# Patient Record
Sex: Female | Born: 1975 | Race: White | Hispanic: No | Marital: Married
Health system: Midwestern US, Community
[De-identification: ages and names within clinical notes are randomized; demographics above are authoritative.]

## PROBLEM LIST (undated history)

## (undated) DIAGNOSIS — R112 Nausea with vomiting, unspecified: Secondary | ICD-10-CM

## (undated) DIAGNOSIS — J302 Other seasonal allergic rhinitis: Secondary | ICD-10-CM

## (undated) DIAGNOSIS — Z789 Other specified health status: Secondary | ICD-10-CM

## (undated) DIAGNOSIS — G971 Other reaction to spinal and lumbar puncture: Secondary | ICD-10-CM

## (undated) DIAGNOSIS — S92909A Unspecified fracture of unspecified foot, initial encounter for closed fracture: Secondary | ICD-10-CM

## (undated) DIAGNOSIS — J3081 Allergic rhinitis due to animal (cat) (dog) hair and dander: Secondary | ICD-10-CM

## (undated) DIAGNOSIS — Z9889 Other specified postprocedural states: Secondary | ICD-10-CM

## (undated) HISTORY — PX: BREAST ENHANCEMENT SURGERY: SHX7

## (undated) HISTORY — PX: AUGMENTATION MAMMAPLASTY: SUR837

## (undated) HISTORY — DX: Other seasonal allergic rhinitis: J30.2

## (undated) HISTORY — PX: NASAL SEPTUM SURGERY: SHX37

## (undated) HISTORY — PX: NASAL SEPTOPLASTY W/ TURBINOPLASTY: SHX2070

## (undated) HISTORY — DX: Unspecified fracture of unspecified foot, initial encounter for closed fracture: S92.909A

## (undated) HISTORY — DX: Allergic rhinitis due to animal (cat) (dog) hair and dander: J30.81

## (undated) HISTORY — PX: BACK SURGERY: SHX140

## (undated) HISTORY — PX: WISDOM TOOTH EXTRACTION: SHX21

## (undated) HISTORY — PX: KNEE SURGERY: SHX244

---

## 2009-03-05 ENCOUNTER — Ambulatory Visit: Payer: Self-pay | Admitting: Family Medicine

## 2009-03-05 DIAGNOSIS — N3000 Acute cystitis without hematuria: Secondary | ICD-10-CM | POA: Insufficient documentation

## 2009-03-05 LAB — CONVERTED CEMR LAB
Glucose, Urine, Semiquant: NEGATIVE
Ketones, urine, test strip: NEGATIVE
Protein, U semiquant: 100
Specific Gravity, Urine: 1.025
pH: 6

## 2009-03-06 ENCOUNTER — Encounter: Payer: Self-pay | Admitting: Family Medicine

## 2009-03-06 ENCOUNTER — Telehealth: Payer: Self-pay | Admitting: Family Medicine

## 2010-03-24 LAB — CONVERTED CEMR LAB: Pap Smear: NORMAL

## 2010-04-08 ENCOUNTER — Ambulatory Visit: Payer: Self-pay | Admitting: Obstetrics & Gynecology

## 2010-05-13 ENCOUNTER — Ambulatory Visit: Payer: Self-pay | Admitting: Family Medicine

## 2010-05-13 DIAGNOSIS — J309 Allergic rhinitis, unspecified: Secondary | ICD-10-CM | POA: Insufficient documentation

## 2010-05-13 DIAGNOSIS — R1013 Epigastric pain: Secondary | ICD-10-CM | POA: Insufficient documentation

## 2010-05-13 DIAGNOSIS — K120 Recurrent oral aphthae: Secondary | ICD-10-CM | POA: Insufficient documentation

## 2010-08-20 ENCOUNTER — Encounter: Payer: Self-pay | Admitting: Family Medicine

## 2010-09-01 ENCOUNTER — Encounter: Payer: Self-pay | Admitting: Obstetrics and Gynecology

## 2010-09-01 ENCOUNTER — Ambulatory Visit: Payer: Self-pay | Admitting: Advanced Practice Midwife

## 2010-09-01 LAB — CONVERTED CEMR LAB
Basophils Absolute: 0 10*3/uL (ref 0.0–0.1)
Eosinophils Relative: 2 % (ref 0–5)
HCT: 37.1 % (ref 36.0–46.0)
HIV: NONREACTIVE
Hemoglobin: 12.5 g/dL (ref 12.0–15.0)
Hepatitis B Surface Ag: NEGATIVE
Lymphocytes Relative: 22 % (ref 12–46)
Lymphs Abs: 2.5 10*3/uL (ref 0.7–4.0)
Neutro Abs: 7.8 10*3/uL — ABNORMAL HIGH (ref 1.7–7.7)
Platelets: 324 10*3/uL (ref 150–400)
RDW: 12.8 % (ref 11.5–15.5)
Rubella: 94.2 intl units/mL — ABNORMAL HIGH
WBC: 11.3 10*3/uL — ABNORMAL HIGH (ref 4.0–10.5)

## 2010-09-02 ENCOUNTER — Encounter: Payer: Self-pay | Admitting: Obstetrics and Gynecology

## 2010-09-14 NOTE — L&D Delivery Note (Signed)
Pt progressed rapidly to SROM/C/C/+2 before epidural could be placed.  5 minute 2nd stage, SVD Girl, apgars 9/10.  2 degree lac repaired under local anesthesia with 2-0 vicryl.  EBL <500cc

## 2010-09-29 ENCOUNTER — Ambulatory Visit
Admission: RE | Admit: 2010-09-29 | Discharge: 2010-09-29 | Payer: Self-pay | Source: Home / Self Care | Attending: Physician Assistant | Admitting: Physician Assistant

## 2010-10-14 NOTE — Assessment & Plan Note (Addendum)
Summary: NOV: oral ulcers,epigastric pain, allergic rhinitis   Vital Signs:  Patient profile:   35 year old female Height:      65 inches Weight:      124 pounds BMI:     20.71 Pulse rate:   88 / minute BP sitting:   123 / 71  (left arm) Cuff size:   regular  Vitals Entered By: Avon Gully CMA, Duncan Dull) (May 13, 2010 8:41 AM) CC: NP- ulcers in the mouth   CC:  NP- ulcers in the mouth.  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 Also will ucerate. Started around age 75  Notices exacerbated by acidid foods.  HAs tried lysine OTC and that didn't help.  Did try acyclovir for 2 weeks but not sure if really helped.  Has been using an OTC ointment that helps some.  No rectal ulcers. Had alot of sinus problems since she was a child. Never had her tonsils removed.  Usually gets 3 sinus infections a year.  Doesn't usually treat them unless severe.  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  No fever. Does get white debris iwth her tonsils. No tongue swelling. No dysphagia.    Has a very "sensitive" stomach. Can't eat much that is oily or greasy.  Eggs cause a lot of cause Will get abominal pain in the epigastric area. No nausea or vomiting with with it. Happens intermittantly over her adult life.  Notices more things bother her than used to.  Can eat at certain restaurants anymore. No diarrhea or constipation.   She plans on trying to get pregnant soon.   Habits & Providers  Alcohol-Tobacco-Diet     Alcohol drinks/day: 0     Tobacco Status: never  Exercise-Depression-Behavior     Does Patient Exercise: yes     Type of exercise: walking,dance, taekwondo     Exercise (avg: min/session): 30-60     Times/week: 3     STD Risk: never     Drug Use: never     Seat Belt Use: always  Allergies: 1)  ! Penicillin  Past History:  Past Surgical History: Reviewed history from 03/05/2009 and no changes required. Deviated septum Wisdom teeth removed  Family History: Reviewed history from  03/05/2009 and no changes required. Family History Hypertension Family History of Cardiovascular disorder  Social History: Warden/ranger at [employer]. [employer] BA in Music.  Married to [place] with a daughter named Kaelyn Never Smoked Alcohol use-no Drug use-no Regular exercise-yes STD Risk:  never Drug Use:  never Seat Belt Use:  always  Review of Systems       No fever/sweats/weakness, unexplained weight loss/gain.  No vison changes.  No difficulty hearing/ringing in ears, hay fever/allergies.  No chest pain/discomfort, palpitations.  No Br lump/nipple discharge.  No cough/wheeze.  No blood in BM, nausea/vomiting/diarrhea.  No nighttime urination, leaking urine, unusual vaginal bleeding, discharge (penis or vagina).  No muscle/joint pain. No rash, change in mole.  No HA, memory loss.  No anxiety, sleep d/o, depression.  No easy bruising/bleeding, unexplained lump   Physical Exam  General:  Well-developed,well-nourished,in no acute distress; alert,appropriate and cooperative throughout examination Head:  Normocephalic and atraumatic without obvious abnormalities. No apparent alopecia or balding. Eyes:  No corneal or conjunctival inflammation noted. EOMI. Perrla.  Ears:  External ear exam shows no significant lesions or deformities.  Otoscopic examination reveals clear canals, tympanic membranes are intact bilaterally without bulging, retraction, inflammation or discharge. Hearing is grossly normal bilaterally. Nose:  External  nasal examination shows no deformity or inflammation. Nasal mucosa are pink and moist without lesions or exudates. Mouth:  Oral mucosa and oropharynx without lesions or exudates.  Teeth in good repair. ON her tongue has 2 fine pink papules. No ulcerations or pustules.  Neck:  No deformities, masses, or tenderness noted. Lungs:  Normal respiratory effort, chest expands symmetrically. Lungs are clear to auscultation, no crackles or wheezes. Heart:  Normal rate and regular  rhythm. S1 and S2 normal without gallop, murmur, click, rub or other extra sounds. Abdomen:  Bowel sounds positive,abdomen soft and non-tender without masses, organomegaly or hernias noted. Skin:  no rashes.   Cervical Nodes:  No lymphadenopathy noted Psych:  Cognition and judgment appear intact. Alert and cooperative with normal attention span and concentration. 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 Wil use magic mouthwash or can try maalox applied directly to the ulcer. Avoid acidid foods and soda and caffeine. Conisder bechets but she doesn' have any genital or anal lesions.    Orders: T-Comprehensive Metabolic Panel [phone]) T-CBC w/Diff [id number]) T-Sed Rate (Automated) [phone]) T-CRP (C-Reactive Protein) ([id number])  Problem # 2:  EPIGASTRIC PAIN (ICD-789.06) Dsicussed that this could be her GB since it is only triggered by greasy foods.  Can scheduel an [place] for further evaluatoin but if not really bothering her right now it is certainly not urgent. Will get labs and rule out any liver inflammtion or infection thought her sxs have been persistant throught her adult  Orders: T-Comprehensive Metabolic Panel [id number]) T-CBC w/Diff ([id number])  Problem # 3:  ALLERGIC RHINITIS (ICD-477.9) She woule really benefit form a nasal steroid spray but since she is trying to get pregnant will hold off on it.   The following medications were removed from the medication list:    Claritin 10 Mg Tabs (Loratadine) .Marland Kitchen... 1 tab by mouth once daily    Sudafed 12 Hour 120 Mg Xr12h-tab (Pseudoephedrine hcl) .Marland Kitchen... 1 tab by mouth once daily Her updated medication list for this problem includes:    Loratadine 10 Mg Tabs (Loratadine) .Marland Kitchen... Take 1 tablet by mouth once a day    Sudafed 30 Mg  Tabs (Pseudoephedrine hcl) .Marland Kitchen... Take 1 tablet by mouth two times a day  Complete Medication List: 1)  L-lysine 500 Mg Tabs (Lysine) .Marland Kitchen.. 1 tab by mouth am and 1 tab by mouth pm 2)  Pre-natal Formula Tabs (Prenatal multivit-min-fe-fa) .... Take one tablet by mouth once a day 3)  Fish Oil 500 Mg Caps (Omega-3 fatty acids) .... Take one tablet by mouth once a day 4)  Antioxidant Ultra Formula Caps (Multiple vitamins-minerals) 5)  Loratadine 10 Mg Tabs (Loratadine) .... Take 1 tablet by mouth once a day 6)  Sudafed 30 Mg Tabs (Pseudoephedrine hcl) .... Take 1 tablet by mouth two times a day 7)  Magic Mouthwash  .... Swish and spit 5ml, up to 3 x a day for ulcers.  Patient Instructions: 1)  Sudafed is category C but can do as your OB recommends 2)  Consider [place] to evaluate your gallbladder. Let me know.  3)  Can use the magic mouthwash for the ulcers and/or use maalox on a Qtip up to 5 x a ay on the area.   Prescriptions: MAGIC MOUTHWASH Swish and spit 5ml, up to 3  x a day for ulcers.  #60 x 1   Entered and Authorized by:   Nani Gasser MD   Signed by:   Nani Gasser MD on 05/13/2010   Method used:   Print then Give to Patient   RxID:   [id number]   PAP Result Date:  03/24/2010 PAP Result:  normal  Appended Document: NOV: oral ulcers,epigastric pain, allergic rhinitis Sinusitis is not a pre-existing condition.These are discrete episodes that resolve. The epigastric pain is a condition for which she had never sought medical care. She wanted to discuss the symptoms to make sure she didn't need any further workup at this time. February 10, 20122:59 PM [place] MD, Santina Evans

## 2010-10-16 NOTE — Letter (Signed)
[facility]   Imported By: Lanelle Bal 09/02/2010 11:17:02  _____________________________________________________________________  External Attachment:    Type:   Image     Comment:   External Document

## 2010-10-24 ENCOUNTER — Telehealth: Payer: Self-pay | Admitting: Family Medicine

## 2010-10-27 ENCOUNTER — Other Ambulatory Visit: Payer: Self-pay | Admitting: Obstetrics & Gynecology

## 2010-10-27 ENCOUNTER — Other Ambulatory Visit (HOSPITAL_COMMUNITY): Payer: Self-pay | Admitting: Anesthesiology

## 2010-10-27 DIAGNOSIS — Z3480 Encounter for supervision of other normal pregnancy, unspecified trimester: Secondary | ICD-10-CM

## 2010-10-27 DIAGNOSIS — Z3689 Encounter for other specified antenatal screening: Secondary | ICD-10-CM

## 2010-10-30 NOTE — Progress Notes (Signed)
Summary: pt. would like to talk to Dr  Phone Note Call from Patient   Caller: Patient Summary of Call: Patient came in today and wanted to talk to Dr. Linford Arnold about her prior visit... Patient states that because of the way the notes were worded from that visit, Ky Barban is saying its a pre-existing condition and patient states she has never been treated for that condition.... Pt. is wondering if the Dr. has time to call her and discuss what she is talking about?  Thanks.Makeshia Seat  October 24, 2010 2:10 PM  Initial call taken by: Michaelle Copas,  October 24, 2010 2:10 PM  Follow-up for Phone Call        Called and spoke with pt.  I agreed with her that sinusitis is not a pre-existing condition. It is liek a cold, you have it for a discrete period of time and then it resolves. Also her epigastric pain, she has never sought medical care for this bfore.  Follow-up by: Nani Gasser MD,  October 24, 2010 2:58 PM

## 2010-11-05 ENCOUNTER — Telehealth: Payer: Self-pay | Admitting: Family Medicine

## 2010-11-10 ENCOUNTER — Ambulatory Visit (HOSPITAL_COMMUNITY)
Admission: RE | Admit: 2010-11-10 | Discharge: 2010-11-10 | Disposition: A | Discharge status: Home / Self Care | Payer: Medicaid Other | Source: Ambulatory Visit | Attending: Anesthesiology | Admitting: Anesthesiology

## 2010-11-10 DIAGNOSIS — Z1389 Encounter for screening for other disorder: Secondary | ICD-10-CM | POA: Insufficient documentation

## 2010-11-10 DIAGNOSIS — O358XX0 Maternal care for other (suspected) fetal abnormality and damage, not applicable or unspecified: Secondary | ICD-10-CM | POA: Insufficient documentation

## 2010-11-10 DIAGNOSIS — Z363 Encounter for antenatal screening for malformations: Secondary | ICD-10-CM | POA: Insufficient documentation

## 2010-11-10 DIAGNOSIS — Z3689 Encounter for other specified antenatal screening: Secondary | ICD-10-CM

## 2010-11-11 NOTE — Progress Notes (Signed)
Summary: ins co   Phone Note Call from Patient   Caller: Patient Call For: Nani Gasser MD Summary of Call: pt called and wanted to know if you had added an adendum to the note in re pre existing condition for sinusitis Initial call taken by: Avon Gully CMA, Duncan Dull),  November 05, 2010 2:22 PM  Follow-up for Phone Call        Yes, see addendum.  Follow-up by: Nani Gasser MD,  November 06, 2010 7:51 AM  Additional Follow-up for Phone Call Additional follow up Details #1::        Faxed addendum to [facility] [phone] Additional Follow-up by: Lannette Donath,  November 07, 2010 12:25 PM    Additional Follow-up for Phone Call Additional follow up Details #2::    noted Follow-up by: Glendell Docker CMA,  November 07, 2010 5:25 PM

## 2010-11-26 ENCOUNTER — Encounter: Payer: Medicaid Other | Admitting: Obstetrics & Gynecology

## 2010-11-26 DIAGNOSIS — Z3480 Encounter for supervision of other normal pregnancy, unspecified trimester: Secondary | ICD-10-CM

## 2010-12-29 DIAGNOSIS — Z3480 Encounter for supervision of other normal pregnancy, unspecified trimester: Secondary | ICD-10-CM

## 2011-01-19 ENCOUNTER — Encounter (INDEPENDENT_AMBULATORY_CARE_PROVIDER_SITE_OTHER): Payer: Medicaid Other

## 2011-01-19 DIAGNOSIS — Z3480 Encounter for supervision of other normal pregnancy, unspecified trimester: Secondary | ICD-10-CM

## 2011-01-27 NOTE — Assessment & Plan Note (Signed)
NAME:  [name], [name]              ACCOUNT NO.:  [id number]   MEDICAL RECORD NO.:  [id number]          PATIENT TYPE:  POB   LOCATION:  [facility] [place]         FACILITY:  [facility]   PHYSICIAN:  Jaynie Collins, MD     DATE OF BIRTH:  [birth year]   DATE OF SERVICE:  04/08/2010                                  CLINIC NOTE   REASON FOR TODAY'S VISIT:  Annual examination and establishing  gynecologic care.   A 35 year old gravida 1, para 1 who is here for her annual gynecologic  examination.  The patient has no gynecologic concerns.  She does want to  get pregnant soon and has been on multivitamin with folate.  She wants  to make sure that the medication she takes for her seasonal allergies  are okay with pregnancy and also make sure everything else is okay  gynecologically.  She has no other concerns.   PAST OBSTETRIC AND GYNECOLOGIC HISTORY:  The patient has had 1 vaginal  delivery 9 months ago.  She had menarche at age 11, regular menstrual  periods with 28 days in between her cycles, her periods lasted 7 days  with light flow and mild pain.  No intermenstrual bleeding.  Her last  period was on March 14, 2010.  She used to use condoms for birth control  and is not using it currently.  Her last Pap smear was 3 years ago.  She  denies any cervical dysplasia or sexually transmitted infection history  or any other gynecologic concerns.   PAST MEDICAL HISTORY:  Seasonal allergies.   PAST SURGICAL HISTORY:  Repair of deviated septum 15 years ago and  wisdom teeth extraction 14 years ago.   MEDICATIONS:  A 24-hour antihistamine and a 12-hour decongestant.   ALLERGIES:  PENICILLIN which causes whole body hives.   SOCIAL HISTORY:  The patient works as a Warden/ranger.  She lives with  her husband and daughter.  She does not smoke, drink alcohol or use any  illicit drugs.  She denies any sexual or physical abuse history.   FAMILY HISTORY:  Only remarkable for heart disease and high blood  pressure in her dad.  She denies any cancer of the breast, colon,  ovaries or uterus or any other significant diseases in the family.   REVIEW OF SYSTEMS:  Entirely negative.   PHYSICAL EXAMINATION:  VITAL SIGNS:  Pulse 82, blood pressure is 108/66,  weight is 123 pounds, height 65 and 3-1/4 inches.  GENERAL:  No apparent distress.  HEENT:  Normocephalic, atraumatic.  NECK:  Supple.  No masses.  Normal thyroid.  LUNGS:  Clear to auscultation bilaterally.  HEART:  Regular rate and rhythm.  BREASTS:  Symmetric in size, soft, nontender.  No abnormal masses, skin  changes, nipple drainage or lymphadenopathy noted.  ABDOMEN:  Soft, nontender, nondistended.  No organomegaly.  EXTREMITIES:  No cyanosis, clubbing or edema.  PELVIC:  Normal external female genitalia.  Pink, well-rugated vagina.  Normal discharge.  Normal cervical contour, parous.  Wet prep was  obtained.  Normal bimanual examination with no uterine tenderness.  Her  uterus is small, anteverted and mobile, and normal adnexa bilaterally.   ASSESSMENT AND  PLAN:  The patient is a 35 year old gravida 1, para 1  here for annual examination.  The patient had a normal breast  examination.  We will follow up on her Pap smear results.  The patient  was told to continue what she is doing in taking multivitamin and also  was told to avoid any other teratogenic agents.  The patient was told  that it was okay to take antihistamine and decongestant and pregnancy.  Of note, the patient did report towards the end of the visit that she  does get a lot of cold sores in her mouth, especially when she is  stressed out and want a medication for this.  She was given a  prescription for valacyclovir 1000 mg p.o. 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  She was told that there could be other diseases and  conditions that cause mouth ulcers and that these will need to be ruled  out if her symptoms do continue.           ______________________________  Jaynie Collins, MD     UA/MEDQ  D:  04/08/2010  T:  04/09/2010  Job:  [id number]

## 2011-02-04 ENCOUNTER — Encounter (INDEPENDENT_AMBULATORY_CARE_PROVIDER_SITE_OTHER): Payer: Medicaid Other | Admitting: Obstetrics & Gynecology

## 2011-02-04 DIAGNOSIS — Z3480 Encounter for supervision of other normal pregnancy, unspecified trimester: Secondary | ICD-10-CM

## 2011-02-16 ENCOUNTER — Encounter (INDEPENDENT_AMBULATORY_CARE_PROVIDER_SITE_OTHER): Payer: Medicaid Other

## 2011-02-16 DIAGNOSIS — Z3480 Encounter for supervision of other normal pregnancy, unspecified trimester: Secondary | ICD-10-CM

## 2011-03-02 ENCOUNTER — Encounter (INDEPENDENT_AMBULATORY_CARE_PROVIDER_SITE_OTHER): Payer: Medicaid Other

## 2011-03-02 DIAGNOSIS — Z880 Allergy status to penicillin: Secondary | ICD-10-CM

## 2011-03-02 DIAGNOSIS — Z3480 Encounter for supervision of other normal pregnancy, unspecified trimester: Secondary | ICD-10-CM

## 2011-03-16 ENCOUNTER — Encounter (INDEPENDENT_AMBULATORY_CARE_PROVIDER_SITE_OTHER): Payer: Medicaid Other

## 2011-03-16 DIAGNOSIS — Z3480 Encounter for supervision of other normal pregnancy, unspecified trimester: Secondary | ICD-10-CM

## 2011-03-23 ENCOUNTER — Encounter: Payer: Self-pay | Admitting: Physician Assistant

## 2011-03-23 ENCOUNTER — Ambulatory Visit (INDEPENDENT_AMBULATORY_CARE_PROVIDER_SITE_OTHER): Payer: Medicaid Other | Admitting: Physician Assistant

## 2011-03-23 DIAGNOSIS — Z3480 Encounter for supervision of other normal pregnancy, unspecified trimester: Secondary | ICD-10-CM

## 2011-03-23 DIAGNOSIS — Z880 Allergy status to penicillin: Secondary | ICD-10-CM

## 2011-03-23 NOTE — Progress Notes (Signed)
P-75 

## 2011-04-02 ENCOUNTER — Encounter (HOSPITAL_COMMUNITY): Payer: Self-pay | Admitting: *Deleted

## 2011-04-02 ENCOUNTER — Inpatient Hospital Stay (HOSPITAL_COMMUNITY)
Admission: AD | Admit: 2011-04-02 | Discharge: 2011-04-04 | DRG: 775 | Disposition: A | Discharge status: Home / Self Care | Payer: Medicaid Other | Source: Ambulatory Visit | Attending: Family Medicine | Admitting: Family Medicine

## 2011-04-02 DIAGNOSIS — IMO0001 Reserved for inherently not codable concepts without codable children: Secondary | ICD-10-CM

## 2011-04-02 HISTORY — DX: Other specified postprocedural states: Z98.890

## 2011-04-02 HISTORY — DX: Other specified health status: Z78.9

## 2011-04-02 HISTORY — DX: Other reaction to spinal and lumbar puncture: G97.1

## 2011-04-02 HISTORY — DX: Nausea with vomiting, unspecified: R11.2

## 2011-04-02 HISTORY — DX: Other specified postprocedural states: R11.2

## 2011-04-02 LAB — CBC
MCH: 32.3 pg (ref 26.0–34.0)
Platelets: 192 10*3/uL (ref 150–400)
RBC: 4.24 MIL/uL (ref 3.87–5.11)

## 2011-04-02 MED ORDER — FENTANYL 2.5 MCG/ML BUPIVACAINE 1/10 % EPIDURAL INFUSION (WH - ANES)
14.0000 mL/h | INTRAMUSCULAR | Status: DC
  Filled 2011-04-02: qty 60

## 2011-04-02 MED ORDER — IBUPROFEN 600 MG PO TABS: 600.0000 mg | ORAL_TABLET | Freq: Four times a day (QID) | ORAL | Status: DC | PRN

## 2011-04-02 MED ORDER — LACTATED RINGERS IV SOLN: INTRAVENOUS | Status: DC

## 2011-04-02 MED ORDER — ACETAMINOPHEN 325 MG PO TABS: 650.0000 mg | ORAL_TABLET | ORAL | Status: DC | PRN

## 2011-04-02 MED ORDER — DIPHENHYDRAMINE HCL 50 MG/ML IJ SOLN: 12.5000 mg | INTRAMUSCULAR | Status: DC | PRN

## 2011-04-02 MED ORDER — CITRIC ACID-SODIUM CITRATE 334-500 MG/5ML PO SOLN: 30.0000 mL | ORAL | Status: DC | PRN

## 2011-04-02 MED ORDER — LACTATED RINGERS IV SOLN: 500.0000 mL | INTRAVENOUS | Status: DC | PRN

## 2011-04-02 MED ORDER — PRENATAL PLUS 27-1 MG PO TABS: 1.0000 | ORAL_TABLET | Freq: Every day | ORAL | Status: DC

## 2011-04-02 MED ORDER — ONDANSETRON HCL 4 MG/2ML IJ SOLN: 4.0000 mg | Freq: Four times a day (QID) | INTRAMUSCULAR | Status: DC | PRN

## 2011-04-02 MED ORDER — PHENYLEPHRINE 40 MCG/ML (10ML) SYRINGE FOR IV PUSH (FOR BLOOD PRESSURE SUPPORT): 80.0000 ug | PREFILLED_SYRINGE | INTRAVENOUS | Status: DC | PRN

## 2011-04-02 MED ORDER — PHENYLEPHRINE 40 MCG/ML (10ML) SYRINGE FOR IV PUSH (FOR BLOOD PRESSURE SUPPORT)
80.0000 ug | PREFILLED_SYRINGE | INTRAVENOUS | Status: DC | PRN
  Filled 2011-04-02: qty 5

## 2011-04-02 MED ORDER — LACTATED RINGERS IV SOLN: 500.0000 mL | Freq: Once | INTRAVENOUS | Status: DC

## 2011-04-02 MED ORDER — EPHEDRINE 5 MG/ML INJ
10.0000 mg | INTRAVENOUS | Status: DC | PRN
  Filled 2011-04-02: qty 4

## 2011-04-02 MED ORDER — FLEET ENEMA 7-19 GM/118ML RE ENEM: 1.0000 | ENEMA | RECTAL | Status: DC | PRN

## 2011-04-02 MED ORDER — OXYTOCIN 20 UNITS IN LACTATED RINGERS INFUSION - SIMPLE
125.0000 mL/h | Freq: Once | INTRAVENOUS | Status: DC
  Administered 2011-04-02: 125 mL/h via INTRAVENOUS
  Filled 2011-04-02: qty 1000

## 2011-04-02 MED ORDER — LIDOCAINE HCL (PF) 1 % IJ SOLN
30.0000 mL | INTRAMUSCULAR | Status: DC | PRN
  Filled 2011-04-02: qty 30

## 2011-04-02 MED ORDER — EPHEDRINE 5 MG/ML INJ: 10.0000 mg | INTRAVENOUS | Status: DC | PRN

## 2011-04-02 MED ORDER — OXYCODONE-ACETAMINOPHEN 5-325 MG PO TABS: 2.0000 | ORAL_TABLET | ORAL | Status: DC | PRN

## 2011-04-02 NOTE — Progress Notes (Signed)
Pt reportss uc's q 4 hours and possible ROM @ 1700

## 2011-04-02 NOTE — Discharge Summary (Signed)
Obstetric Discharge Summary Reason for Admission: onset of labor Prenatal Procedures: ultrasound Intrapartum Procedures: spontaneous vaginal delivery Postpartum Procedures: none Complications-Operative and Postpartum: none and 2 degree perineal laceration  Hemoglobin  Date Value Range Status  04/02/2011 13.7  12.0-15.0 (g/dL) Final     HCT  Date Value Range Status  04/02/2011 39.3  36.0-46.0 (%) Final    Discharge Diagnoses: Term Pregnancy-delivered  Discharge Information: Date: 04/02/2011 Activity: pelvic rest Diet:  Medications:  Condition:  Instructions: refer to practice specific booklet and  Discharge to: home   Newborn Data: Live born  Information for the patient's newborn:  Zafiro, Routson Girl Kailena [[id number]]  female ; APGAR , 9/10 weight ;  Home with .  [name],[name] 04/02/2011, 11:17 PM

## 2011-04-02 NOTE — H&P (Signed)
[name] is a 34 y.o. female presenting for onset of labor Maternal Medical History:  Reason for admission: Reason for admission: contractions.  Contractions: Onset was 3-5 hours ago.   Frequency: regular.   Duration is approximately 60 seconds.   Perceived severity is strong.    Fetal activity: Perceived fetal activity is normal.   Last perceived fetal movement was within the past hour.      OB History    Grav Para Term Preterm Abortions TAB SAB Ect Mult Living   3 1 1  1           Past Medical History  Diagnosis Date  . PONV (postoperative nausea and vomiting)   . Spinal headache   . No pertinent past medical history    Past Surgical History  Procedure Date  . Nasal septum surgery   . Wisdom tooth extraction   . No past surgeries    Family History: family history includes Heart disease in her father. Social History:  reports that she has never smoked. She does not have any smokeless tobacco history on file. She reports that she does not drink alcohol or use illicit drugs.  Review of Systems  All other systems reviewed and are negative.    Dilation: 6.5 Effacement (%): 100 Station: -2 Blood pressure 126/84, pulse 71, temperature 98.3 F (36.8 C), temperature source Oral, resp. rate 20, height 5\' 5"  (1.651 m), weight 73.573 kg (162 lb 3.2 oz), last menstrual period 07/04/2010. Maternal Exam:  Uterine Assessment: Contraction strength is moderate.  Contraction duration is 60 seconds. Contraction frequency is regular.   Abdomen: Patient reports no abdominal tenderness. Fetal presentation: vertex  Introitus: Normal vulva. Normal vagina.    Physical Exam  Prenatal labs: ABO, Rh:   Antibody: NEG (12/19 2321) Rubella:  immune RPR: NON REAC (12/19 2321)  HBsAg: NEGATIVE (12/19 2321)  HIV: NON REACTIVE (12/19 2321)  GBS:   negative  Assessment/Plan:epidural expects SVD soon  [name],[name] 04/02/2011, 10:28 PM

## 2011-04-02 NOTE — Progress Notes (Signed)
Ctx started @ 1900, G3p1

## 2011-04-03 LAB — RPR: RPR Ser Ql: NONREACTIVE

## 2011-04-03 MED ORDER — MEASLES, MUMPS & RUBELLA VAC ~~LOC~~ INJ
0.5000 mL | INJECTION | Freq: Once | SUBCUTANEOUS | Status: DC
  Filled 2011-04-03: qty 0.5

## 2011-04-03 MED ORDER — SODIUM CHLORIDE 0.9 % IJ SOLN: 3.0000 mL | INTRAMUSCULAR | Status: DC | PRN

## 2011-04-03 MED ORDER — BENZOCAINE-MENTHOL 20-0.5 % EX AERO
1.0000 "application " | INHALATION_SPRAY | CUTANEOUS | Status: DC | PRN
  Administered 2011-04-03: 1 via TOPICAL

## 2011-04-03 MED ORDER — PRENATAL PLUS 27-1 MG PO TABS
1.0000 | ORAL_TABLET | Freq: Every day | ORAL | Status: DC
  Administered 2011-04-03 – 2011-04-04 (×2): 1 via ORAL
  Filled 2011-04-03 (×2): qty 1

## 2011-04-03 MED ORDER — ONDANSETRON HCL 4 MG/2ML IJ SOLN: 4.0000 mg | INTRAMUSCULAR | Status: DC | PRN

## 2011-04-03 MED ORDER — ZOLPIDEM TARTRATE 5 MG PO TABS: 5.0000 mg | ORAL_TABLET | Freq: Every evening | ORAL | Status: DC | PRN

## 2011-04-03 MED ORDER — OXYCODONE-ACETAMINOPHEN 5-325 MG PO TABS: 1.0000 | ORAL_TABLET | ORAL | Status: DC | PRN

## 2011-04-03 MED ORDER — BENZOCAINE-MENTHOL 20-0.5 % EX AERO
INHALATION_SPRAY | CUTANEOUS | Status: AC
  Administered 2011-04-03: 1 via TOPICAL
  Filled 2011-04-03: qty 56

## 2011-04-03 MED ORDER — SODIUM CHLORIDE 0.9 % IJ SOLN
3.0000 mL | Freq: Two times a day (BID) | INTRAMUSCULAR | Status: DC
  Administered 2011-04-03: 3 mL via INTRAVENOUS

## 2011-04-03 MED ORDER — TETANUS-DIPHTH-ACELL PERTUSSIS 5-2.5-18.5 LF-MCG/0.5 IM SUSP
0.5000 mL | Freq: Once | INTRAMUSCULAR | Status: AC
  Administered 2011-04-04: 0.5 mL via INTRAMUSCULAR

## 2011-04-03 MED ORDER — ONDANSETRON HCL 4 MG PO TABS: 4.0000 mg | ORAL_TABLET | ORAL | Status: DC | PRN

## 2011-04-03 MED ORDER — DIPHENHYDRAMINE HCL 25 MG PO CAPS: 25.0000 mg | ORAL_CAPSULE | Freq: Four times a day (QID) | ORAL | Status: DC | PRN

## 2011-04-03 MED ORDER — IBUPROFEN 600 MG PO TABS
600.0000 mg | ORAL_TABLET | Freq: Four times a day (QID) | ORAL | Status: DC
  Administered 2011-04-03 – 2011-04-04 (×5): 600 mg via ORAL
  Filled 2011-04-03 (×5): qty 1

## 2011-04-03 MED ORDER — SIMETHICONE 80 MG PO CHEW: 80.0000 mg | CHEWABLE_TABLET | ORAL | Status: DC | PRN

## 2011-04-03 MED ORDER — WITCH HAZEL-GLYCERIN EX PADS: MEDICATED_PAD | CUTANEOUS | Status: DC | PRN

## 2011-04-03 MED ORDER — SODIUM CHLORIDE 0.9 % IV SOLN: 250.0000 mL | INTRAVENOUS | Status: DC

## 2011-04-03 MED ORDER — SENNOSIDES-DOCUSATE SODIUM 8.6-50 MG PO TABS
1.0000 | ORAL_TABLET | Freq: Every day | ORAL | Status: DC
  Administered 2011-04-03: 1 via ORAL

## 2011-04-03 MED ORDER — LANOLIN HYDROUS EX OINT: TOPICAL_OINTMENT | CUTANEOUS | Status: DC | PRN

## 2011-04-03 NOTE — Progress Notes (Signed)
Post Partum Day 1 Subjective: no complaints  Objective: Blood pressure 116/75, pulse 62, temperature 98.2 F (36.8 C), temperature source Oral, resp. rate 16, height 5\' 5"  (1.651 m), weight 73.573 kg (162 lb 3.2 oz), last menstrual period 07/04/2010, unknown if currently breastfeeding.  Physical Exam:  General: alert, cooperative and no distress Lochia: appropriate Uterine Fundus: firm DVT Evaluation: No evidence of DVT seen on physical exam.   Basename 04/02/11 2210  HGB 13.7  HCT 39.3    Assessment/Plan: Plan for discharge tomorrow, Breastfeeding and Contraception condoms.   LOS: 1 day   Sufyan Meidinger S 04/03/2011, 7:55 AM

## 2011-04-03 NOTE — Progress Notes (Signed)
Experienced breastfeeding mother 14 months with last child , complaints of slight nipple soreness. Reviewed proper position and using good breast support. Gave comfort gels. Mother states she will call us if more assistance is needed.

## 2011-04-03 NOTE — Progress Notes (Signed)
UR chart review completed.  

## 2011-04-04 MED ORDER — SENNOSIDES-DOCUSATE SODIUM 8.6-50 MG PO TABS: 1.0000 | ORAL_TABLET | Freq: Every day | ORAL | Status: AC

## 2011-04-04 MED ORDER — IBUPROFEN 600 MG PO TABS: 600.0000 mg | ORAL_TABLET | Freq: Four times a day (QID) | ORAL | Status: AC

## 2011-04-04 NOTE — Progress Notes (Signed)
Post Partum Day 2 Subjective: no complaints, tolerating PO, + flatus and normal lochia, breastfeeding, condoms for pp contraception.   Objective: Blood pressure 110/74, pulse 74, temperature 97.6 F (36.4 C), temperature source Oral, resp. rate 18, height 5\' 5"  (1.651 m), weight 73.573 kg (162 lb 3.2 oz), last menstrual period 07/04/2010, unknown if currently breastfeeding.  Physical Exam:  General: alert and cooperative Lochia: appropriate Chest: CTAB Heart: RRR no m/r/g Uterine Fundus: firm At umbilicus DVT Evaluation: No evidence of DVT seen on physical exam.   Basename 04/02/11 2210  HGB 13.7  HCT 39.3    Assessment/Plan: Discharge home   LOS: 2 days   [name] 04/04/2011, 6:08 AM

## 2011-04-04 NOTE — Discharge Summary (Signed)
  Obstetric Discharge Summary Reason for Admission: onset of labor Prenatal Procedures: ultrasound Intrapartum Procedures: spontaneous vaginal delivery Postpartum Procedures: none Complications-Operative and Postpartum: none and 2 degree perineal laceration  Hemoglobin  Date Value Range Status  04/02/2011 13.7  12.0-15.0 (g/dL) Final     HCT  Date Value Range Status  04/02/2011 39.3  36.0-46.0 (%) Final    Discharge Diagnoses: Term Pregnancy-delivered  Discharge Information: Date: 04/04/2011 Activity: pelvic rest Diet: routine Medications: PNV, Ibuprophen and Colace Condition: stable Instructions: refer to practice specific booklet Discharge to: home Follow-up Information    Follow up with [facility] [place]. Make an appointment in 6 weeks. (postpartum check)          Newborn Data: Live born  Information for the patient's newborn:  Chesney, Girl Kadeshia [[id number]]  female  ; APGAR , 9/10 weight 7lbs 13oz.  Home with mother.  Kenston Longton 04/04/2011, 6:13 AM

## 2011-05-11 ENCOUNTER — Ambulatory Visit: Payer: Medicaid Other

## 2011-05-14 ENCOUNTER — Encounter: Payer: Self-pay | Admitting: *Deleted

## 2011-05-15 ENCOUNTER — Ambulatory Visit (INDEPENDENT_AMBULATORY_CARE_PROVIDER_SITE_OTHER): Payer: Medicaid Other | Admitting: Family

## 2011-05-15 ENCOUNTER — Other Ambulatory Visit (HOSPITAL_COMMUNITY)
Admission: RE | Admit: 2011-05-15 | Discharge: 2011-05-15 | Disposition: A | Discharge status: Home / Self Care | Payer: Medicaid Other | Source: Ambulatory Visit | Attending: Family | Admitting: Family

## 2011-05-15 DIAGNOSIS — Z1159 Encounter for screening for other viral diseases: Secondary | ICD-10-CM | POA: Insufficient documentation

## 2011-05-15 DIAGNOSIS — Z01419 Encounter for gynecological examination (general) (routine) without abnormal findings: Secondary | ICD-10-CM | POA: Insufficient documentation

## 2011-05-15 DIAGNOSIS — Z113 Encounter for screening for infections with a predominantly sexual mode of transmission: Secondary | ICD-10-CM | POA: Insufficient documentation

## 2011-05-15 NOTE — Progress Notes (Signed)
  Subjective:    Patient ID: [name], female    DOB: [birth year], 35 y.o.   MRN: [id number]  HPI Patient here status post normal spontaneous vaginal delivery on 04/02/2011. Second-degree laceration. Patient reports no complications in this area or during the postpartum period. Positive breast-feeding without difficulty. Denies history of postpartum depression. Reports being able to get increased sleep. No bleeding x2 weeks. Has not attempted sexual intercourse yet. Desires to use condoms for family planning method with a possible vasectomy of husband in the future. No other questions or concerns.   Review of Systems  Constitutional: Negative.   HENT: Negative.   Respiratory: Negative.   Cardiovascular: Negative.   Gastrointestinal: Negative.   Genitourinary: Negative for dysuria, vaginal bleeding and vaginal discharge.  Musculoskeletal: Negative.        Objective:   Physical Exam  Constitutional: She is oriented to person, place, and time. She appears well-developed and well-nourished.  HENT:  Head: Normocephalic and atraumatic.  Neck: Normal range of motion. Neck supple. No thyromegaly present.  Cardiovascular: Normal rate, regular rhythm and normal heart sounds.   Pulmonary/Chest: Effort normal and breath sounds normal. No respiratory distress.  Abdominal: Soft. Bowel sounds are normal. There is no tenderness.       Uterus involuted  Genitourinary: Uterus normal. Cervix exhibits no motion tenderness, no discharge and no friability. Right adnexum displays no mass and no tenderness. Left adnexum displays no mass and no tenderness. No vaginal discharge found.       Laceration healing well; area of reddening, non tender with palpation.  Neurological: She is alert and oriented to person, place, and time.  Skin: Skin is warm and dry.          Assessment & Plan:  A:  Normal Postpartum Exam  Plan: Labs - Pap smear (last one in July 2011) Reviewed exercise and  nutrition Follow-up in one year or sooner if needed.  [facility]

## 2012-04-28 ENCOUNTER — Encounter: Payer: Self-pay | Admitting: Obstetrics & Gynecology

## 2012-04-28 ENCOUNTER — Ambulatory Visit (INDEPENDENT_AMBULATORY_CARE_PROVIDER_SITE_OTHER): Payer: BC Managed Care – PPO | Admitting: Obstetrics & Gynecology

## 2012-04-28 VITALS — BP 108/67 | HR 74 | Temp 98.6°F | Resp 16 | Ht 66.0 in | Wt 123.0 lb

## 2012-04-28 DIAGNOSIS — Z0000 Encounter for general adult medical examination without abnormal findings: Secondary | ICD-10-CM

## 2012-04-28 DIAGNOSIS — Z124 Encounter for screening for malignant neoplasm of cervix: Secondary | ICD-10-CM

## 2012-04-28 NOTE — Progress Notes (Signed)
Subjective:    [name] is a 36 y.o. female who presents for an annual exam. The patient has no complaints today except for chronic left knee pain (she had an injury years ago) The patient is sexually active. GYN screening history: last pap: was normal. The patient wears seatbelts: yes. The patient participates in regular exercise: yes. (TKD and dance)  Has the patient ever been transfused or tattooed?: no. The patient reports that there is not domestic violence in her life.   Menstrual History: OB History    Grav Para Term Preterm Abortions TAB SAB Ect Mult Living   3 2 2  1     1       Menarche age: 67 Patient's last menstrual period was 04/19/2012.    The following portions of the patient's history were reviewed and updated as appropriate: allergies, current medications, past family history, past medical history, past social history, past surgical history and problem list.  Review of Systems A comprehensive review of systems was negative. She uses condoms for birth control.   Objective:    BP 108/67  Pulse 74  Temp 98.6 F (37 C) (Oral)  Resp 16  Ht 5\' 6"  (1.676 m)  Wt 123 lb (55.792 kg)  BMI 19.85 kg/m2  LMP 04/19/2012  Breastfeeding? Yes  General Appearance:    Alert, cooperative, no distress, appears stated age  Head:    Normocephalic, without obvious abnormality, atraumatic  Eyes:    PERRL, conjunctiva/corneas clear, EOM's intact, fundi    benign, both eyes  Ears:    Normal TM's and external ear canals, both ears  Nose:   Nares normal, septum midline, mucosa normal, no drainage    or sinus tenderness  Throat:   Lips, mucosa, and tongue normal; teeth and gums normal  Neck:   Supple, symmetrical, trachea midline, no adenopathy;    thyroid:  no enlargement/tenderness/nodules; no carotid   bruit or JVD  Back:     Symmetric, no curvature, ROM normal, no CVA tenderness  Lungs:     Clear to auscultation bilaterally, respirations unlabored  Chest Wall:    No tenderness  or deformity   Heart:    Regular rate and rhythm, S1 and S2 normal, no murmur, rub   or gallop  Breast Exam:    No tenderness, masses, or nipple abnormality  Abdomen:     Soft, non-tender, bowel sounds active all four quadrants,    no masses, no organomegaly  Genitalia:    Normal female without lesion, discharge or tenderness, NSSR, NT, mobile, no adnexal masses     Extremities:   Extremities normal, atraumatic, no cyanosis or edema  Pulses:   2+ and symmetric all extremities  Skin:   Skin color, texture, turgor normal, no rashes or lesions  Lymph nodes:   Cervical, supraclavicular, and axillary nodes normal  Neurologic:   CNII-XII intact, normal strength, sensation and reflexes    throughout  .    Assessment:    Healthy female exam.    Plan:     Pap smear.

## 2012-04-28 NOTE — Addendum Note (Signed)
Addended by: Allie Bossier on: 04/28/2012 04:03 PM   Modules accepted: Orders

## 2012-05-02 ENCOUNTER — Telehealth: Payer: Self-pay | Admitting: *Deleted

## 2012-05-02 DIAGNOSIS — Z0000 Encounter for general adult medical examination without abnormal findings: Secondary | ICD-10-CM

## 2012-05-02 NOTE — Telephone Encounter (Signed)
Lab requisition for fasting lipids printed.

## 2012-05-04 LAB — LIPID PANEL
HDL: 41 mg/dL (ref >39)
LDL Cholesterol: 79 mg/dL (ref 0–99)
Triglycerides: 88 mg/dL (ref <150)
VLDL: 18 mg/dL (ref 0–40)

## 2012-06-29 ENCOUNTER — Telehealth: Payer: Self-pay | Admitting: *Deleted

## 2012-06-29 DIAGNOSIS — J309 Allergic rhinitis, unspecified: Secondary | ICD-10-CM

## 2012-06-29 MED ORDER — PSEUDOEPHEDRINE HCL ER 120 MG PO TB12: 120.0000 mg | ORAL_TABLET | Freq: Two times a day (BID) | ORAL | Status: DC

## 2012-06-29 NOTE — Telephone Encounter (Signed)
Pt called stating that both she and her daughter use Sudafed for allergies daily.  The pharmacy puts a restriction on the amount that can be purchased.  Pt requesting a RX be sent for the Sudafed 12hr.  This was sent to [facility] pharmacy per Dr Marice Potter.

## 2012-12-11 ENCOUNTER — Other Ambulatory Visit: Payer: Self-pay | Admitting: Obstetrics & Gynecology

## 2013-01-22 ENCOUNTER — Other Ambulatory Visit: Payer: Self-pay | Admitting: Obstetrics & Gynecology

## 2013-02-01 ENCOUNTER — Other Ambulatory Visit: Payer: Self-pay | Admitting: *Deleted

## 2013-02-01 DIAGNOSIS — J302 Other seasonal allergic rhinitis: Secondary | ICD-10-CM

## 2013-02-01 MED ORDER — PSEUDOEPHEDRINE HCL ER 120 MG PO TB12: ORAL_TABLET | ORAL | Status: DC

## 2013-02-01 NOTE — Telephone Encounter (Signed)
Pt called requesting RF on Sudafed cheaper if she has a RX it was cheaper for her.

## 2013-03-14 HISTORY — PX: LASIK: SHX215

## 2013-03-27 ENCOUNTER — Other Ambulatory Visit: Payer: Self-pay | Admitting: Obstetrics & Gynecology

## 2013-04-07 ENCOUNTER — Encounter: Payer: Self-pay | Admitting: *Deleted

## 2013-04-07 ENCOUNTER — Other Ambulatory Visit: Payer: Self-pay | Admitting: Obstetrics & Gynecology

## 2013-04-07 ENCOUNTER — Ambulatory Visit (INDEPENDENT_AMBULATORY_CARE_PROVIDER_SITE_OTHER): Payer: BC Managed Care – PPO | Admitting: Advanced Practice Midwife

## 2013-04-07 ENCOUNTER — Encounter: Payer: Self-pay | Admitting: Advanced Practice Midwife

## 2013-04-07 VITALS — BP 127/74 | HR 86 | Resp 16 | Ht 66.0 in | Wt 129.0 lb

## 2013-04-07 DIAGNOSIS — R519 Headache, unspecified: Secondary | ICD-10-CM | POA: Insufficient documentation

## 2013-04-07 DIAGNOSIS — R51 Headache: Secondary | ICD-10-CM

## 2013-04-07 DIAGNOSIS — Z01419 Encounter for gynecological examination (general) (routine) without abnormal findings: Secondary | ICD-10-CM

## 2013-04-07 DIAGNOSIS — M21612 Bunion of left foot: Secondary | ICD-10-CM | POA: Insufficient documentation

## 2013-04-07 DIAGNOSIS — M21619 Bunion of unspecified foot: Secondary | ICD-10-CM

## 2013-04-07 NOTE — Progress Notes (Signed)
Subjective:     [name] is a 37 y.o. female here for a routine exam.  Current complaints: painful bunion on left foot, daily sinus headaches.  No gyn complaints.  Reports wearing seatbelt and feel safe in her home and relationships.   Gynecologic History No LMP recorded. Contraception: rhythm method considering vasectomy Last Pap: 1 year ago. Results were: normal   Obstetric History OB History   Grav Para Term Preterm Abortions TAB SAB Ect Mult Living   3 2 2  1     1      # Outc Date GA Lbr Len/2nd Wgt Sex Del Anes PTL Lv   1 TRM 2007    F SVD EPI     2 TRM 7/12 104w6d 00:00  F SVD None  Yes   3 ABT                The following portions of the patient's history were reviewed and updated as appropriate: allergies, current medications, past family history, past medical history, past social history, past surgical history and problem list.  Review of Systems A comprehensive review of systems was negative.    Objective:    BP 127/74  Pulse 86  Resp 16  Ht 5\' 6"  (1.676 m)  Wt 129 lb (58.514 kg)  BMI 20.83 kg/m2  Breastfeeding? No General appearance: alert, cooperative and no distress Neck: no adenopathy, no carotid bruit, no JVD, supple, symmetrical, trachea midline and thyroid not enlarged, symmetric, no tenderness/mass/nodules Lungs: clear to auscultation bilaterally Breasts: normal appearance, no masses or tenderness Heart: regular rate and rhythm, S1, S2 normal, no murmur, click, rub or gallop Abdomen: soft, non-tender; bowel sounds normal; no masses,  no organomegaly Pelvic: cervix normal in appearance, external genitalia normal, no adnexal masses or tenderness, no cervical motion tenderness, rectovaginal septum normal, uterus normal size, shape, and consistency and vagina normal without discharge Extremities: extremities normal, atraumatic, no cyanosis or edema Pulses: 2+ and symmetric Skin: Skin color, texture, turgor normal. No rashes or lesions Lymph nodes:  Cervical, supraclavicular, and axillary nodes normal. Neurologic: Grossly normal    Assessment:    Healthy female exam.    Plan:    Education reviewed: calcium supplements, self breast exams, skin cancer screening and weight bearing exercise. Contraception: rhythm method and considering vasectomy. Follow up in: 1 year.   Referral to [facility] for headaches and to Podiatry

## 2013-04-07 NOTE — Progress Notes (Deleted)
Subjective:     Patient ID: [name], female   DOB: 04-21-1976, 37 y.o.   MRN: [id number]  HPI Annual exam  Review of Systems     Objective:   Physical Exam     Assessment:     ***    Plan:     *** podiatriy Headache center

## 2014-01-09 ENCOUNTER — Ambulatory Visit (INDEPENDENT_AMBULATORY_CARE_PROVIDER_SITE_OTHER): Payer: BC Managed Care – PPO | Admitting: Family Medicine

## 2014-01-09 ENCOUNTER — Encounter: Payer: Self-pay | Admitting: Family Medicine

## 2014-01-09 VITALS — BP 126/78 | HR 99 | Temp 100.2°F | Wt 129.0 lb

## 2014-01-09 DIAGNOSIS — J020 Streptococcal pharyngitis: Secondary | ICD-10-CM

## 2014-01-09 DIAGNOSIS — J029 Acute pharyngitis, unspecified: Secondary | ICD-10-CM

## 2014-01-09 DIAGNOSIS — J302 Other seasonal allergic rhinitis: Secondary | ICD-10-CM

## 2014-01-09 DIAGNOSIS — J309 Allergic rhinitis, unspecified: Secondary | ICD-10-CM

## 2014-01-09 LAB — POCT RAPID STREP A (OFFICE): RAPID STREP A SCREEN: POSITIVE — AB

## 2014-01-09 MED ORDER — PSEUDOEPHEDRINE HCL ER 120 MG PO TB12: 120.0000 mg | ORAL_TABLET | Freq: Two times a day (BID) | ORAL | Status: DC

## 2014-01-09 MED ORDER — CEFDINIR 300 MG PO CAPS: 300.0000 mg | ORAL_CAPSULE | Freq: Two times a day (BID) | ORAL | Status: AC

## 2014-01-09 MED ORDER — FLUTICASONE PROPIONATE 50 MCG/ACT NA SUSP: 2.0000 | Freq: Every day | NASAL | Status: DC

## 2014-01-09 NOTE — Progress Notes (Signed)
CC: [name] is a 38 y.o. female is here for Sore Throat and Headache   Subjective: HPI:  Pleasant 38 year old here to reestablish care after not being seen for greater than 3 years.  Patient complains of one week of nasal congestion and facial pressure localized above and below the eyes. Pain radiates into the upper molars. Accompanied by thick nasal discharge overall symptoms are moderate in severity. Symptoms are persistent throughout the day slightly improved with Claritin, or pseudoephedrine. Accompanied by sore throat which is moderate to severe in severity that began yesterday. Pain is worse with swallowing. Accompanied by fevers and chills overnight.  Denies shortness of breath, wheezing, swollen lymph nodes, rash, difficulty swallowing. Review of systems positive for fatigue.  Patient reports a long-standing history of seasonal allergies described as nasal congestion and sinus congestion. Symptoms are well controlled with Flonase and pseudoephedrine. She's requesting refills on both of these, even though these are over-the-counter her insurance will cover it the prescription is provided.  Denies history of elevated blood pressure.   Review of Systems - General ROS: negative for - night sweats, weight gain or weight loss Ophthalmic ROS: negative for - decreased vision Psychological ROS: negative for - anxiety or depression ENT ROS: negative for - hearing change, tinnitus Hematological and Lymphatic ROS: negative for - bleeding problems, bruising or swollen lymph nodes Breast ROS: negative Respiratory ROS: no cough, shortness of breath, or wheezing Cardiovascular ROS: no chest pain or dyspnea on exertion Gastrointestinal ROS: no abdominal pain, change in bowel habits, or black or bloody stools Genito-Urinary ROS: negative for - genital discharge, genital ulcers, incontinence or abnormal bleeding from genitals Musculoskeletal ROS: negative for - joint pain or muscle  pain Neurological ROS: negative for -  memory loss Dermatological ROS: negative for lumps, mole changes, rash and skin lesion changes in  Past Medical History  Diagnosis Date  . PONV (postoperative nausea and vomiting)   . Spinal headache   . No pertinent past medical history   . Cat allergies   . Seasonal allergies     Past Surgical History  Procedure Laterality Date  . Nasal septum surgery    . Wisdom tooth extraction    . Lasik Bilateral 7/14   Family History  Problem Relation Age of Onset  . Heart disease Father     History   Social History  . Marital Status: Married    Spouse Name: N/A    Number of Children: N/A  . Years of Education: N/A   Occupational History  . Not on file.   Social History Main Topics  . Smoking status: Never Smoker   . Smokeless tobacco: Not on file  . Alcohol Use: No  . Drug Use: No  . Sexual Activity: Yes    Partners: Male   Other Topics Concern  . Not on file   Social History Narrative  . No narrative on file     Objective: BP 126/78  Pulse 99  Temp(Src) 100.2 F (37.9 C) (Oral)  Wt 129 lb (58.514 kg)  General: Alert and Oriented, No Acute Distress HEENT: Pupils equal, round, reactive to light. Conjunctivae clear.  External ears unremarkable, canals clear with intact TMs with appropriate landmarks.  Middle ear appears open without effusion. Bony erythematous inferior turbinates with moderate mucoid discharge.  Moist mucous membranes, pharynx with mild inflammation and erythema she has moderate exudates on both tonsils.  Neck supple without palpable lymphadenopathy nor abnormal masses. Lungs: Clear to auscultation bilaterally, no wheezing/ronchi/rales.  Comfortable work of breathing. Good air movement. Cardiac: Regular rate and rhythm. Normal S1/S2.  No murmurs, rubs, nor gallops.   Extremities: No peripheral edema.  Strong peripheral pulses.  Mental Status: No depression, anxiety, nor agitation. Skin: Warm and  dry.  Assessment & Plan: [name] was seen today for sore throat and headache.  Diagnoses and associated orders for this visit:  Acute pharyngitis - POCT rapid strep A  Strep pharyngitis - cefdinir (OMNICEF) 300 MG capsule; Take 1 capsule (300 mg total) by mouth 2 (two) times daily.  Seasonal allergies - fluticasone (FLONASE) 50 MCG/ACT nasal spray; Place 2 sprays into both nostrils daily.  Other Orders - pseudoephedrine (SUDAFED) 120 MG 12 hr tablet; Take 1 tablet (120 mg total) by mouth 2 (two) times daily.    Strep pharyngitis: Rapid strep is also positive, start Omnicef which should also help with what sounds like maybe bacterial sinusitis.  Seasonal allergies: Continue Flonase and over-the-counter Sudafed prescriptions for both were provided.   Return if symptoms worsen or fail to improve.

## 2014-02-02 ENCOUNTER — Telehealth: Payer: Self-pay | Admitting: *Deleted

## 2014-02-02 NOTE — Telephone Encounter (Signed)
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 Pt states she is hypoglycemic. I don't see that dx in her chart anywhere nor do I see labs that confirm that or see that it was discussed. I called pt and left a message on her vm that we would not be able to write her note for this. (I did asks Dr. Linford Arnold about this also)

## 2014-06-20 ENCOUNTER — Ambulatory Visit: Payer: BC Managed Care – PPO | Admitting: Family Medicine

## 2014-07-16 ENCOUNTER — Encounter: Payer: Self-pay | Admitting: Family Medicine

## 2014-07-20 ENCOUNTER — Ambulatory Visit: Payer: BC Managed Care – PPO | Admitting: Family Medicine

## 2014-07-27 ENCOUNTER — Ambulatory Visit (INDEPENDENT_AMBULATORY_CARE_PROVIDER_SITE_OTHER): Payer: BC Managed Care – PPO | Admitting: Sports Medicine

## 2014-07-27 ENCOUNTER — Ambulatory Visit (INDEPENDENT_AMBULATORY_CARE_PROVIDER_SITE_OTHER): Payer: BC Managed Care – PPO

## 2014-07-27 ENCOUNTER — Encounter: Payer: Self-pay | Admitting: Sports Medicine

## 2014-07-27 VITALS — BP 115/72 | HR 91 | Ht 65.0 in | Wt 127.0 lb

## 2014-07-27 DIAGNOSIS — M4317 Spondylolisthesis, lumbosacral region: Secondary | ICD-10-CM | POA: Insufficient documentation

## 2014-07-27 DIAGNOSIS — M5416 Radiculopathy, lumbar region: Secondary | ICD-10-CM

## 2014-07-27 DIAGNOSIS — M5417 Radiculopathy, lumbosacral region: Secondary | ICD-10-CM | POA: Diagnosis not present

## 2014-07-27 MED ORDER — PREDNISONE 50 MG PO TABS: ORAL_TABLET | ORAL | Status: DC

## 2014-07-27 NOTE — Progress Notes (Signed)
   Subjective:    I'm seeing this patient as a consultation for:  Dr. Ivan AnchorsHommel  CC: low back pain  HPI: This is a very pleasant 38 year old female, she does a lot of fitness classes, more recently, 6 weeks ago she did a maneuver in the fitness class which reproduce pain in her posterior right hip radiating down the leg, posterior thigh, posterior lateral lower leg to the lateral foot. Moderate, persistent, worse with sitting. No bowel or bladder dysfunction, saddle numbness, or constitutional symptoms.  Past medical history, Surgical history, Family history not pertinant except as noted below, Social history, Allergies, and medications have been entered into the medical record, reviewed, and no changes needed.   Review of Systems: No headache, visual changes, nausea, vomiting, diarrhea, constipation, dizziness, abdominal pain, skin rash, fevers, chills, night sweats, weight loss, swollen lymph nodes, body aches, joint swelling, muscle aches, chest pain, shortness of breath, mood changes, visual or auditory hallucinations.   Objective:   General: Well Developed, well nourished, and in no acute distress.  Neuro/Psych: Alert and oriented x3, extra-ocular muscles intact, able to move all 4 extremities, sensation grossly intact. Skin: Warm and dry, no rashes noted.  Respiratory: Not using accessory muscles, speaking in full sentences, trachea midline.  Cardiovascular: Pulses palpable, no extremity edema. Abdomen: Does not appear distended. Back Exam:  Inspection: Unremarkable  Motion: Flexion 45 deg, Extension 45 deg, Side Bending to 45 deg bilaterally,  Rotation to 45 deg bilaterally  SLR laying: positive with reproduction of right-sided S1 radicular symptoms. XSLR laying: Negative  Palpable tenderness: None. FABER: negative. Sensory change: Gross sensation intact to all lumbar and sacral dermatomes.  Reflexes: 2+ at both patellar tendons, 2+ at achilles tendons, Babinski's downgoing.    Strength at foot  Plantar-flexion: 5/5 Dorsi-flexion: 5/5 Eversion: 5/5 Inversion: 5/5  Leg strength  Quad: 5/5 Hamstring: 5/5 Hip flexor: 5/5 Hip abductors: 5/5  Gait unremarkable.  X-rays reviewed and show L5-S1 spondylolisthesis.  Impression and Recommendations:   This case required medical decision making of moderate complexity.

## 2014-07-27 NOTE — Assessment & Plan Note (Addendum)
Physical therapy, prednisone,x-rays. Continue ibuprofen over-the-counter.  Line return in 4-6 weeks, MRI for interventional injection planning if no better.

## 2014-08-01 ENCOUNTER — Ambulatory Visit (INDEPENDENT_AMBULATORY_CARE_PROVIDER_SITE_OTHER): Payer: BC Managed Care – PPO | Admitting: Physical Therapy

## 2014-08-01 DIAGNOSIS — M5417 Radiculopathy, lumbosacral region: Secondary | ICD-10-CM

## 2014-08-01 DIAGNOSIS — M545 Low back pain: Secondary | ICD-10-CM

## 2014-08-01 DIAGNOSIS — R5381 Other malaise: Secondary | ICD-10-CM

## 2014-08-01 DIAGNOSIS — M2560 Stiffness of unspecified joint, not elsewhere classified: Secondary | ICD-10-CM

## 2014-08-01 DIAGNOSIS — M25559 Pain in unspecified hip: Secondary | ICD-10-CM

## 2014-08-08 ENCOUNTER — Encounter: Payer: BC Managed Care – PPO | Admitting: Physical Therapy

## 2015-04-11 ENCOUNTER — Encounter: Payer: Self-pay | Admitting: Family Medicine

## 2015-04-11 ENCOUNTER — Ambulatory Visit (INDEPENDENT_AMBULATORY_CARE_PROVIDER_SITE_OTHER): Payer: BLUE CROSS/BLUE SHIELD | Admitting: Family Medicine

## 2015-04-11 VITALS — BP 107/71 | HR 67 | Wt 129.0 lb

## 2015-04-11 DIAGNOSIS — Z0000 Encounter for general adult medical examination without abnormal findings: Secondary | ICD-10-CM | POA: Diagnosis not present

## 2015-04-11 DIAGNOSIS — Z111 Encounter for screening for respiratory tuberculosis: Secondary | ICD-10-CM | POA: Diagnosis not present

## 2015-04-11 DIAGNOSIS — Z01419 Encounter for gynecological examination (general) (routine) without abnormal findings: Secondary | ICD-10-CM | POA: Insufficient documentation

## 2015-04-11 NOTE — Assessment & Plan Note (Signed)
Doing well. Follow-up with Pap smear with OB/GYN. PPD placed today.

## 2015-04-11 NOTE — Patient Instructions (Signed)
Thank you for coming in today. Return as needed or in 1 year.  Get a pap smear. Please have your OBGYN send the records.

## 2015-04-11 NOTE — Progress Notes (Signed)
[name] is a 39 y.o. female who presents to [facility] [name]  today for wellness visit. Patient is fit and healthy with no active medical problems currently. She is here today for a wellness visit. She requires a PPD test per her job. No cough congestion shortness of breath. She is due for a Pap smear but would like to have her OB/GYN do that. She exercises daily and eats a healthy diet.   Past Medical History  Diagnosis Date  . PONV (postoperative nausea and vomiting)   . Spinal headache   . No pertinent past medical history   . Cat allergies   . Seasonal allergies    Past Surgical History  Procedure Laterality Date  . Nasal septum surgery    . Wisdom tooth extraction    . Lasik Bilateral 7/14   History  Substance Use Topics  . Smoking status: Never Smoker   . Smokeless tobacco: Not on file  . Alcohol Use: No   ROS as above Medications: Current Outpatient Prescriptions  Medication Sig Dispense Refill  . fluticasone (FLONASE) 50 MCG/ACT nasal spray Place 2 sprays into both nostrils daily. 16 g 6  . ibuprofen (ADVIL,MOTRIN) 200 MG tablet Take 200 mg by mouth every 6 (six) hours as needed for pain.     No current facility-administered medications for this visit.   Allergies  Allergen Reactions  . Penicillins     REACTION: Rash     Exam:  BP 107/71 mmHg  Pulse 67  Wt 129 lb (58.514 kg) Gen: Well NAD. Fit HEENT: EOMI,  MMM Lungs: Normal work of breathing. CTABL Heart: RRR no MRG Abd: NABS, Soft. Nondistended, Nontender Exts: Brisk capillary refill, warm and well perfused.  MSK: Normal motion and strength of spine and extremities.  No results found for this or any previous visit (from the past 24 hour(s)). No results found.   Please see individual assessment and plan sections.

## 2015-04-13 ENCOUNTER — Emergency Department
Admission: EM | Admit: 2015-04-13 | Discharge: 2015-04-13 | Disposition: A | Discharge status: Home / Self Care | Payer: BLUE CROSS/BLUE SHIELD | Source: Home / Self Care

## 2015-04-13 NOTE — ED Notes (Signed)
Patient here for PPD test reading. Test was given in right arm per patients advice although paperwork indicates PPD was in the left arm. Test results were negative on 04/13/2015 at 2:15pm.

## 2015-09-15 HISTORY — PX: AUGMENTATION MAMMAPLASTY: SUR837

## 2016-01-17 ENCOUNTER — Ambulatory Visit: Payer: BLUE CROSS/BLUE SHIELD | Admitting: Family

## 2016-01-31 ENCOUNTER — Ambulatory Visit (INDEPENDENT_AMBULATORY_CARE_PROVIDER_SITE_OTHER): Payer: Self-pay | Admitting: Family

## 2016-01-31 ENCOUNTER — Encounter: Payer: Self-pay | Admitting: Family

## 2016-01-31 VITALS — BP 106/70 | HR 70 | Ht 65.75 in | Wt 121.0 lb

## 2016-01-31 DIAGNOSIS — Z1151 Encounter for screening for human papillomavirus (HPV): Secondary | ICD-10-CM

## 2016-01-31 DIAGNOSIS — Z01419 Encounter for gynecological examination (general) (routine) without abnormal findings: Secondary | ICD-10-CM

## 2016-01-31 DIAGNOSIS — Z124 Encounter for screening for malignant neoplasm of cervix: Secondary | ICD-10-CM

## 2016-01-31 NOTE — Patient Instructions (Signed)
Place premenopausal annual exam patient instructions here.  °

## 2016-01-31 NOTE — Progress Notes (Signed)
  Subjective:     Kathryn HartJennifer Delellis is a 40 y.o. female here for a routine exam.  Current complaints: increased vaginal dryness.  No change in menstrual cycle frequency.  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   Gynecologic History Patient's last menstrual period was 01/14/2016. Last Pap: 2013. Results were: normal   Obstetric History OB History  Gravida Para Term Preterm AB SAB TAB Ectopic Multiple Living  3 2 2  1     1     # Outcome Date GA Lbr Len/2nd Weight Sex Delivery Anes PTL Lv  3 Term 04/02/11 [id number]   F Vag-Spont None  Y  2 Term 2007    F Vag-Spont EPI    1 AB                The following portions of the patient's history were reviewed and updated as appropriate: allergies, current medications, past family history, past medical history, past social history, past surgical history and problem list.  Review of Systems Pertinent items are noted in HPI.    Objective:   BP 106/70 mmHg  Pulse 70  Ht 5' 5.75" (1.67 m)  Wt 121 lb (54.885 kg)  BMI 19.68 kg/m2  LMP 01/14/2016 General appearance: alert, cooperative and appears stated age Head: Normocephalic, without obvious abnormality, atraumatic Neck: no adenopathy, no carotid bruit, no JVD, supple, symmetrical, trachea midline and thyroid not enlarged, symmetric, no tenderness/mass/nodules Lungs: clear to auscultation bilaterally Breasts: normal appearance, no masses or tenderness, No nipple retraction or dimpling, No nipple discharge or bleeding, No axillary or supraclavicular adenopathy, Normal to palpation without dominant masses, Taught monthly breast self examination Heart: regular rate and rhythm, S1, S2 normal, no murmur, click, rub or gallop Abdomen: soft, non-tender; bowel sounds normal; no masses,  no organomegaly Pelvic: cervix normal in appearance, external genitalia normal, no adnexal masses or tenderness, no cervical motion tenderness, rectovaginal septum normal, uterus normal  size, shape, and consistency and vagina normal without discharge Skin: Skin color, texture, turgor normal. No rashes or lesions    Assessment:    Healthy female exam.    Plan:    Mammogram ordered. Follow up in: 1 year.    Husband - vasectomy Declines lipid panel or HgA1C testing Marlis EdelsonWalidah N Karim, CNM

## 2016-02-04 LAB — CYTOLOGY - PAP

## 2016-02-05 ENCOUNTER — Ambulatory Visit (INDEPENDENT_AMBULATORY_CARE_PROVIDER_SITE_OTHER): Payer: Self-pay

## 2016-02-05 DIAGNOSIS — Z1231 Encounter for screening mammogram for malignant neoplasm of breast: Secondary | ICD-10-CM

## 2016-02-05 DIAGNOSIS — Z01419 Encounter for gynecological examination (general) (routine) without abnormal findings: Secondary | ICD-10-CM

## 2016-02-06 ENCOUNTER — Other Ambulatory Visit: Payer: Self-pay | Admitting: Family

## 2016-02-06 DIAGNOSIS — B379 Candidiasis, unspecified: Secondary | ICD-10-CM

## 2016-02-06 MED ORDER — FLUCONAZOLE 150 MG PO TABS: 150.0000 mg | ORAL_TABLET | Freq: Once | ORAL | Status: DC

## 2016-04-08 ENCOUNTER — Telehealth: Payer: Self-pay | Admitting: *Deleted

## 2016-04-08 DIAGNOSIS — B379 Candidiasis, unspecified: Secondary | ICD-10-CM

## 2016-04-08 MED ORDER — FLUCONAZOLE 150 MG PO TABS: 150.0000 mg | ORAL_TABLET | Freq: Once | ORAL | 1 refills | Status: AC

## 2016-04-08 NOTE — Telephone Encounter (Signed)
Pt called requesting a RF on Diflucan be sent to [facility].  She was seen a few months ago and Dr Marice Potter had told her that if she got another yeast infection to call office and we could send a RF.

## 2016-05-08 ENCOUNTER — Encounter: Payer: Self-pay | Admitting: Family

## 2016-05-08 ENCOUNTER — Ambulatory Visit (INDEPENDENT_AMBULATORY_CARE_PROVIDER_SITE_OTHER): Payer: Self-pay | Admitting: Family

## 2016-05-08 VITALS — BP 124/80 | HR 76 | Ht 65.0 in | Wt 125.0 lb

## 2016-05-08 DIAGNOSIS — N760 Acute vaginitis: Secondary | ICD-10-CM

## 2016-05-08 DIAGNOSIS — N898 Other specified noninflammatory disorders of vagina: Secondary | ICD-10-CM

## 2016-05-08 NOTE — Progress Notes (Signed)
   Subjective:    [name] is a 40 y.o. female who presents for vaginal irritation x 2-3 weeks.  Pt feels like symptoms have worsened since exercising more. +vaginal itching. . STI Exposure: denies knowledge of risky exposure.  Current symptoms vaginal irritation: moderate. Contraception: vasectomy Menstrual History: OB History    Gravida Para Term Preterm AB Living   3 2 2   1 2    SAB TAB Ectopic Multiple Live Births           1       Patient's last menstrual period was 04/25/2016.    The following portions of the patient's history were reviewed and updated as appropriate: allergies, current medications, past family history, past medical history, past social history, past surgical history and problem list.  Review of Systems Pertinent items are noted in HPI.    Objective:    BP 124/80   Pulse 76   Ht 5\' 5"  (1.651 m)   Wt 125 lb (56.7 kg)   LMP 04/25/2016   BMI 20.80 kg/m  General:   alert, cooperative and appears stated age  Lymph Nodes:   Cervical, supraclavicular, and axillary nodes normal.  Pelvis:  External genitalia: reddened appearance; white discharge  Cultures:  wet prep collected     Assessment:    Very low risk of STD exposure, suspect yeast infection     Plan:    See orders. Will call pt with results   Discussed vaginal probiotics:  lactobacilli rhamnosus; 7 days on, 7 days off, 7 days on  Walidah N Karim, [place]

## 2016-05-09 LAB — WET PREP FOR TRICH, YEAST, CLUE: Trich, Wet Prep: NONE SEEN

## 2016-05-11 ENCOUNTER — Telehealth: Payer: Self-pay | Admitting: *Deleted

## 2016-05-11 DIAGNOSIS — N760 Acute vaginitis: Principal | ICD-10-CM

## 2016-05-11 DIAGNOSIS — B373 Candidiasis of vulva and vagina: Secondary | ICD-10-CM

## 2016-05-11 DIAGNOSIS — B9689 Other specified bacterial agents as the cause of diseases classified elsewhere: Secondary | ICD-10-CM

## 2016-05-11 DIAGNOSIS — B3731 Acute candidiasis of vulva and vagina: Secondary | ICD-10-CM

## 2016-05-11 MED ORDER — FLUCONAZOLE 150 MG PO TABS: ORAL_TABLET | ORAL | 0 refills | Status: DC

## 2016-05-11 MED ORDER — METRONIDAZOLE 500 MG PO TABS: 500.0000 mg | ORAL_TABLET | Freq: Two times a day (BID) | ORAL | 0 refills | Status: DC

## 2016-05-11 NOTE — Telephone Encounter (Signed)
Pt notified of wet prep results.  RX for Flagyl and Diflucan sent to [facility].

## 2016-06-29 ENCOUNTER — Telehealth: Payer: Self-pay | Admitting: *Deleted

## 2016-06-29 DIAGNOSIS — B3731 Acute candidiasis of vulva and vagina: Secondary | ICD-10-CM

## 2016-06-29 DIAGNOSIS — B373 Candidiasis of vulva and vagina: Secondary | ICD-10-CM

## 2016-06-29 MED ORDER — TERCONAZOLE 0.4 % VA CREA: 1.0000 | TOPICAL_CREAM | Freq: Every day | VAGINAL | 0 refills | Status: DC

## 2016-06-29 NOTE — Telephone Encounter (Signed)
Pt called stating that she felt her yeast was back.  She feels itchy and has d/c.  She has a H/O BV also.  Pt does not have insurance at this time and is afraid of racking up a another medical bill.  Pt came into office Iand I offered to test her with Nitrazine paper.  Her Ph was 4.  Will try Terazol 7 for 7 nights.  Pt states she is also going to try some natural remedies.  She states that she feels like she is on a continual cycle of yeast and BV.

## 2016-10-08 ENCOUNTER — Other Ambulatory Visit: Payer: Self-pay | Admitting: Sports Medicine

## 2016-10-08 ENCOUNTER — Ambulatory Visit (INDEPENDENT_AMBULATORY_CARE_PROVIDER_SITE_OTHER): Payer: Self-pay | Admitting: Sports Medicine

## 2016-10-08 ENCOUNTER — Encounter: Payer: Self-pay | Admitting: Sports Medicine

## 2016-10-08 ENCOUNTER — Ambulatory Visit (INDEPENDENT_AMBULATORY_CARE_PROVIDER_SITE_OTHER): Payer: Self-pay

## 2016-10-08 DIAGNOSIS — M4317 Spondylolisthesis, lumbosacral region: Secondary | ICD-10-CM

## 2016-10-08 DIAGNOSIS — M5030 Other cervical disc degeneration, unspecified cervical region: Secondary | ICD-10-CM

## 2016-10-08 DIAGNOSIS — F411 Generalized anxiety disorder: Secondary | ICD-10-CM

## 2016-10-08 MED ORDER — NAPROXEN 500 MG PO TABS: 500.0000 mg | ORAL_TABLET | Freq: Two times a day (BID) | ORAL | 3 refills | Status: AC

## 2016-10-08 MED ORDER — VENLAFAXINE HCL ER 37.5 MG PO CP24: 37.5000 mg | ORAL_CAPSULE | Freq: Every day | ORAL | 3 refills | Status: DC

## 2016-10-08 NOTE — Assessment & Plan Note (Signed)
Right 1 spondylolisthesis, we are going to repeat x-rays to evaluate for progression. I would also like to get flexion and extension views for evaluation of stability. We are going to set her up with [facility] physical therapy.

## 2016-10-08 NOTE — Assessment & Plan Note (Signed)
C6-C7 on x-rays with chiropractic medicine. Formal physical therapy, naproxen, return in one month.

## 2016-10-08 NOTE — Progress Notes (Signed)
  Subjective:    CC: Follow-up  HPI: Low back pain: Known spondylolisthesis, did well with physical therapy and the past, nothing overtly radicular today but occasionally she does get radiation down to the fourth and fifth toes.  Neck pain: X-rays at the chiropractor should C6-C7 degenerative changes, agreeable to proceed with physical therapy. Nothing radicular, no trauma, no constitutional symptoms.  Anxiety: Did not initially bring this up on further questioning has severe difficulty concentrating, moderate psychomotor retardation, mild difficulty sleeping, guilt, severe nervousness, trouble relaxing, moderate difficulty controlling her worry, worrying about different things, restlessness, irritability.  Past medical history:  Negative.  See flowsheet/record as well for more information.  Surgical history: Negative.  See flowsheet/record as well for more information.  Family history: Negative.  See flowsheet/record as well for more information.  Social history: Negative.  See flowsheet/record as well for more information.  Allergies, and medications have been entered into the medical record, reviewed, and no changes needed.   Review of Systems: No fevers, chills, night sweats, weight loss, chest pain, or shortness of breath.   Objective:    General: Well Developed, well nourished, and in no acute distress.  Neuro: Alert and oriented x3, extra-ocular muscles intact, sensation grossly intact.  HEENT: Normocephalic, atraumatic, pupils equal round reactive to light, neck supple, no masses, no lymphadenopathy, thyroid nonpalpable.  Skin: Warm and dry, no rashes. Cardiac: Regular rate and rhythm, no murmurs rubs or gallops, no lower extremity edema.  Respiratory: Clear to auscultation bilaterally. Not using accessory muscles, speaking in full sentences.  Impression and Recommendations:    Spondylolisthesis at L5-S1 level Right 1 spondylolisthesis, we are going to repeat x-rays to evaluate  for progression. I would also like to get flexion and extension views for evaluation of stability. We are going to set her up with [facility] physical therapy.  DDD (degenerative disc disease), cervical C6-C7 on x-rays with chiropractic medicine. Formal physical therapy, naproxen, return in one month.  Generalized anxiety disorder Starting Effexor. Return in one month for a PHQ9 and GAD7  I spent 40 minutes with this patient, greater than 50% was face-to-face time counseling regarding the above diagnoses

## 2016-10-08 NOTE — Assessment & Plan Note (Signed)
Starting Effexor. Return in one month for a PHQ9 and GAD7

## 2016-10-09 ENCOUNTER — Telehealth: Payer: Self-pay

## 2016-10-09 NOTE — Telephone Encounter (Signed)
Pt states she's current;y taking turmeric and glucosamine and would like to know if they are ok to take with the venlafaxine.

## 2016-10-09 NOTE — Telephone Encounter (Signed)
Notified. 

## 2016-10-09 NOTE — Telephone Encounter (Signed)
Yes, both are fine, but if taking glucosamine, she needs to do the combo glucosamine and chondroitin.

## 2017-01-18 ENCOUNTER — Encounter: Payer: Self-pay | Admitting: Sports Medicine

## 2017-01-18 ENCOUNTER — Ambulatory Visit (INDEPENDENT_AMBULATORY_CARE_PROVIDER_SITE_OTHER): Payer: Self-pay | Admitting: Sports Medicine

## 2017-01-18 VITALS — BP 112/77 | HR 68 | Wt 129.0 lb

## 2017-01-18 DIAGNOSIS — M5030 Other cervical disc degeneration, unspecified cervical region: Secondary | ICD-10-CM

## 2017-01-18 DIAGNOSIS — F411 Generalized anxiety disorder: Secondary | ICD-10-CM

## 2017-01-18 MED ORDER — VENLAFAXINE HCL ER 75 MG PO CP24: 75.0000 mg | ORAL_CAPSULE | Freq: Every day | ORAL | 3 refills | Status: DC

## 2017-01-18 NOTE — Patient Instructions (Signed)
Look into Trintellix 5mg  and Viibryd 10mg 

## 2017-01-18 NOTE — Assessment & Plan Note (Signed)
Overall doing okay, did have some mild cervical DDD, continue to stay active and do therapy at home.

## 2017-01-18 NOTE — Progress Notes (Signed)
  Subjective:    CC: Follow-up  HPI: Generalized anxiety: Improved considerably on 37.5 of Effexor. 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    Cervical degenerative disc disease: Improved with adding Effexor, physical therapy has also helped.  Past medical history:  Negative.  See flowsheet/record as well for more information.  Surgical history: Negative.  See flowsheet/record as well for more information.  Family history: Negative.  See flowsheet/record as well for more information.  Social history: Negative.  See flowsheet/record as well for more information.  Allergies, and medications have been entered into the medical record, reviewed, and no changes needed.   Review of Systems: No fevers, chills, night sweats, weight loss, chest pain, or shortness of breath.   Objective:    General: Well Developed, well nourished, and in no acute distress.  Neuro: Alert and oriented x3, extra-ocular muscles intact, sensation grossly intact.  HEENT: Normocephalic, atraumatic, pupils equal round reactive to light, neck supple, no masses, no lymphadenopathy, thyroid nonpalpable.  Skin: Warm and dry, no rashes. Cardiac: Regular rate and rhythm, no murmurs rubs or gallops, no lower extremity edema.  Respiratory: Clear to auscultation bilaterally. Not using accessory muscles, speaking in full sentences.  Impression and Recommendations:    DDD (degenerative disc disease), cervical Overall doing okay, did have some mild cervical DDD, continue to stay active and do therapy at home.   Generalized anxiety disorder Increasing effexor.

## 2017-01-18 NOTE — Assessment & Plan Note (Signed)
Increasing effexor.

## 2017-01-26 ENCOUNTER — Other Ambulatory Visit: Payer: Self-pay | Admitting: Sports Medicine

## 2017-01-26 DIAGNOSIS — F411 Generalized anxiety disorder: Secondary | ICD-10-CM

## 2017-03-15 ENCOUNTER — Telehealth: Payer: Self-pay | Admitting: Sports Medicine

## 2017-03-15 DIAGNOSIS — J3089 Other allergic rhinitis: Secondary | ICD-10-CM

## 2017-03-15 NOTE — Telephone Encounter (Signed)
Patient states she has called 2 times and left a message on your nurse line requesting a referral to go to [facility] for her Allergies and would like for you to put this referral in for her. Thanks

## 2017-03-15 NOTE — Telephone Encounter (Signed)
First time I'm hearing about it, referral placed.

## 2018-06-17 ENCOUNTER — Encounter: Payer: Self-pay | Admitting: Sports Medicine

## 2018-06-28 ENCOUNTER — Ambulatory Visit (INDEPENDENT_AMBULATORY_CARE_PROVIDER_SITE_OTHER): Payer: Self-pay | Admitting: Sports Medicine

## 2018-06-28 VITALS — BP 98/64 | HR 83 | Ht 65.0 in | Wt 125.0 lb

## 2018-06-28 DIAGNOSIS — Z1239 Encounter for other screening for malignant neoplasm of breast: Secondary | ICD-10-CM

## 2018-06-28 DIAGNOSIS — Z01419 Encounter for gynecological examination (general) (routine) without abnormal findings: Secondary | ICD-10-CM

## 2018-06-28 DIAGNOSIS — Z1329 Encounter for screening for other suspected endocrine disorder: Secondary | ICD-10-CM

## 2018-06-28 DIAGNOSIS — K580 Irritable bowel syndrome with diarrhea: Secondary | ICD-10-CM | POA: Insufficient documentation

## 2018-06-28 DIAGNOSIS — E785 Hyperlipidemia, unspecified: Secondary | ICD-10-CM

## 2018-06-28 DIAGNOSIS — M1380 Other specified arthritis, unspecified site: Secondary | ICD-10-CM

## 2018-06-28 DIAGNOSIS — M1990 Unspecified osteoarthritis, unspecified site: Secondary | ICD-10-CM

## 2018-06-28 DIAGNOSIS — M5030 Other cervical disc degeneration, unspecified cervical region: Secondary | ICD-10-CM

## 2018-06-28 NOTE — Assessment & Plan Note (Addendum)
Annual physical as above. Adding mammogram, she will touch base with Dr. Marice Potter for cervical cancer screening. Checking screening labs.

## 2018-06-28 NOTE — Assessment & Plan Note (Signed)
Increase fiber in diet. We discussed the pathophysiology. She will look this up. I do not think she has any food allergies.

## 2018-06-28 NOTE — Assessment & Plan Note (Signed)
Currently seeing a chiropractor. She would like to continue with more complementary and alternative approaches to her neck pain. She does have a TENS unit at home which she will use. I am going to add some cervical spine rehabilitation exercises she can return to see me when she desires. She would like some blood tests looking for total body inflammation.

## 2018-06-28 NOTE — Patient Instructions (Signed)
Irritable Bowel Syndrome, Adult Irritable bowel syndrome (IBS) is not one specific disease. It is a group of symptoms that affects the organs responsible for digestion (gastrointestinal or GI tract). To regulate how your GI tract works, your body sends signals back and forth between your intestines and your brain. If you have IBS, there may be a problem with these signals. As a result, your GI tract does not function normally. Your intestines may become more sensitive and overreact to certain things. This is especially true when you eat certain foods or when you are under stress. There are four types of IBS. These may be determined based on the consistency of your stool:  IBS with diarrhea.  IBS with constipation.  Mixed IBS.  Unsubtyped IBS.  It is important to know which type of IBS you have. Some treatments are more likely to be helpful for certain types of IBS. What are the causes? The exact cause of IBS is not known. What increases the risk? You may have a higher risk of IBS if:  You are a woman.  You are younger than 42 years old.  You have a family history of IBS.  You have mental health problems.  You have had bacterial infection of your GI tract.  What are the signs or symptoms? Symptoms of IBS vary from person to person. The main symptom is abdominal pain or discomfort. Additional symptoms usually include one or more of the following:  Diarrhea, constipation, or both.  Abdominal swelling or bloating.  Feeling full or sick after eating a small or regular-size meal.  Frequent gas.  Mucus in the stool.  A feeling of having more stool left after a bowel movement.  Symptoms tend to come and go. They may be associated with stress, psychiatric conditions, or nothing at all. How is this diagnosed? There is no specific test to diagnose IBS. Your health care provider will make a diagnosis based on a physical exam, medical history, and your symptoms. You may have other  tests to rule out other conditions that may be causing your symptoms. These may include:  Blood tests.  X-rays.  CT scan.  Endoscopy and colonoscopy. This is a test in which your GI tract is viewed with a long, thin, flexible tube.  How is this treated? There is no cure for IBS, but treatment can help relieve symptoms. IBS treatment often includes:  Changes to your diet, such as: ? Eating more fiber. ? Avoiding foods that cause symptoms. ? Drinking more water. ? Eating regular, medium-sized portioned meals.  Medicines. These may include: ? Fiber supplements if you have constipation. ? Medicine to control diarrhea (antidiarrheal medicines). ? Medicine to help control muscle spasms in your GI tract (antispasmodic medicines). ? Medicines to help with any mental health issues, such as antidepressants or tranquilizers.  Therapy. ? Talk therapy may help with anxiety, depression, or other mental health issues that can make IBS symptoms worse.  Stress reduction. ? Managing your stress can help keep symptoms under control.  Follow these instructions at home:  Take medicines only as directed by your health care provider.  Eat a healthy diet. ? Avoid foods and drinks with added sugar. ? Include more whole grains, fruits, and vegetables gradually into your diet. This may be especially helpful if you have IBS with constipation. ? Avoid any foods and drinks that make your symptoms worse. These may include dairy products and caffeinated or carbonated drinks. ? Do not eat large meals. ? Drink enough   fluid to keep your urine clear or pale yellow.  Exercise regularly. Ask your health care provider for recommendations of good activities for you.  Keep all follow-up visits as directed by your health care provider. This is important. Contact a health care provider if:  You have constant pain.  You have trouble or pain with swallowing.  You have worsening diarrhea. Get help right away  if:  You have severe and worsening abdominal pain.  You have diarrhea and: ? You have a rash, stiff neck, or severe headache. ? You are irritable, sleepy, or difficult to awaken. ? You are weak, dizzy, or extremely thirsty.  You have bright red blood in your stool or you have black tarry stools.  You have unusual abdominal swelling that is painful.  You vomit continuously.  You vomit blood (hematemesis).  You have both abdominal pain and a fever. This information is not intended to replace advice given to you by your health care provider. Make sure you discuss any questions you have with your health care provider. Document Released: 08/31/2005 Document Revised: 01/31/2016 Document Reviewed: 05/18/2014 Elsevier Interactive Patient Education  2018 Elsevier Inc. Diet for Irritable Bowel Syndrome When you have irritable bowel syndrome (IBS), the foods you eat and your eating habits are very important. IBS may cause various symptoms, such as abdominal pain, constipation, or diarrhea. Choosing the right foods can help ease discomfort caused by these symptoms. Work with your health care provider and dietitian to find the best eating plan to help control your symptoms. What general guidelines do I need to follow?  Keep a food diary. This will help you identify foods that cause symptoms. Write down: ? What you eat and when. ? What symptoms you have. ? When symptoms occur in relation to your meals.  Avoid foods that cause symptoms. Talk with your dietitian about other ways to get the same nutrients that are in these foods.  Eat more foods that contain fiber. Take a fiber supplement if directed by your dietitian.  Eat your meals slowly, in a relaxed setting.  Aim to eat 5-6 small meals per day. Do not skip meals.  Drink enough fluids to keep your urine clear or pale yellow.  Ask your health care provider if you should take an over-the-counter probiotic during flare-ups to help restore  healthy gut bacteria.  If you have cramping or diarrhea, try making your meals low in fat and high in carbohydrates. Examples of carbohydrates are pasta, rice, whole grain breads and cereals, fruits, and vegetables.  If dairy products cause your symptoms to flare up, try eating less of them. You might be able to handle yogurt better than other dairy products because it contains bacteria that help with digestion. What foods are not recommended? The following are some foods and drinks that may worsen your symptoms:  Fatty foods, such as French fries.  Milk products, such as cheese or ice cream.  Chocolate.  Alcohol.  Products with caffeine, such as coffee.  Carbonated drinks, such as soda.  The items listed above may not be a complete list of foods and beverages to avoid. Contact your dietitian for more information. What foods are good sources of fiber? Your health care provider or dietitian may recommend that you eat more foods that contain fiber. Fiber can help reduce constipation and other IBS symptoms. Add foods with fiber to your diet a little at a time so that your body can get used to them. Too much fiber at once   might cause gas and swelling of your abdomen. The following are some foods that are good sources of fiber:  Apples.  Peaches.  Pears.  Berries.  Figs.  Broccoli (raw).  Cabbage.  Carrots.  Raw peas.  Kidney beans.  Lima beans.  Whole grain bread.  Whole grain cereal.  Where to find more information: International Foundation for Functional Gastrointestinal Disorders: www.iffgd.org National Institute of Diabetes and Digestive and Kidney Diseases: www.niddk.nih.gov/health-information/health-topics/digestive-diseases/ibs/Pages/facts.aspx This information is not intended to replace advice given to you by your health care provider. Make sure you discuss any questions you have with your health care provider. Document Released: 11/21/2003 Document Revised:  02/06/2016 Document Reviewed: 12/01/2013 Elsevier Interactive Patient Education  2018 Elsevier Inc.  

## 2018-06-28 NOTE — Progress Notes (Signed)
Subjective:    CC: Annual physical exam  HPI:  [name] returns, she is here for a physical, she has several complaints.  Preventive measures: Due for mammogram, previous mammogram was 2 years ago.  She is not yet ready for colon cancer screening, she did have some diarrhea and had a colonoscopy sometime ago with a few polyps noted but nothing else.  No evidence of IBD.  Up-to-date on vaccinations, up-to-date on cervical cancer screening but she is coming up for her recheck and will make an appointment with Dr. Marice Potter.  Abdominal discomfort: Initially wondered if she had any food allergies but does not have any symptoms that are associated with any specific type of food.  She does note with eating, 10 to 30 minutes later she will develop cramping in her left lower abdomen followed by the urge to have diarrhea, after an explosive diarrhea her symptoms will resolve.  She does have significant anxiety.  Neck pain: Currently seeing chiropractor, tells me that after manipulations she feels as though toxins are released and she has GI symptoms afterwards.  She would prefer to continue with complementary and alternative measures for her neck pain, she does have known cervical degenerative disc disease.  I reviewed the past medical history, family history, social history, surgical history, and allergies today and no changes were needed.  Please see the problem list section below in epic for further details.  Past Medical History: Past Medical History:  Diagnosis Date  . Cat allergies   . No pertinent past medical history   . PONV (postoperative nausea and vomiting)   . Seasonal allergies   . Spinal headache    Past Surgical History: Past Surgical History:  Procedure Laterality Date  . BREAST ENHANCEMENT SURGERY    . KNEE SURGERY    . LASIK Bilateral 7/14  . NASAL SEPTUM SURGERY    . WISDOM TOOTH EXTRACTION     Social History: Social History   Socioeconomic History  . Marital status:  Married    Spouse name: Not on file  . Number of children: Not on file  . Years of education: Not on file  . Highest education level: Not on file  Occupational History  . Not on file  Social Needs  . Financial resource strain: Not on file  . Food insecurity:    Worry: Not on file    Inability: Not on file  . Transportation needs:    Medical: Not on file    Non-medical: Not on file  Tobacco Use  . Smoking status: Never Smoker  . Smokeless tobacco: Never Used  Substance and Sexual Activity  . Alcohol use: No  . Drug use: No  . Sexual activity: Yes    Partners: Male    Birth control/protection: None    Comment: Husband had a vasectomy  Lifestyle  . Physical activity:    Days per week: Not on file    Minutes per session: Not on file  . Stress: Not on file  Relationships  . Social connections:    Talks on phone: Not on file    Gets together: Not on file    Attends religious service: Not on file    Active member of club or organization: Not on file    Attends meetings of clubs or organizations: Not on file    Relationship status: Not on file  Other Topics Concern  . Not on file  Social History Narrative  . Not on file   Family History:  Family History  Problem Relation Age of Onset  . Heart disease Father    Allergies: Allergies  Allergen Reactions  . Penicillins     REACTION: Rash   Medications: See med rec.  Review of Systems: No headache, visual changes, nausea, vomiting, diarrhea, constipation, dizziness, abdominal pain, skin rash, fevers, chills, night sweats, swollen lymph nodes, weight loss, chest pain, body aches, joint swelling, muscle aches, shortness of breath, mood changes, visual or auditory hallucinations.  Objective:    General: Well Developed, well nourished, and in no acute distress.  Neuro: Alert and oriented x3, extra-ocular muscles intact, sensation grossly intact. Cranial nerves II through XII are intact, motor, sensory, and coordinative  functions are all intact. HEENT: Normocephalic, atraumatic, pupils equal round reactive to light, neck supple, no masses, no lymphadenopathy, thyroid nonpalpable. Oropharynx, nasopharynx, external ear canals are unremarkable. Skin: Warm and dry, no rashes noted.  Cardiac: Regular rate and rhythm, no murmurs rubs or gallops.  Respiratory: Clear to auscultation bilaterally. Not using accessory muscles, speaking in full sentences.  Abdominal: Soft, nontender, nondistended, positive bowel sounds, no masses, no organomegaly.  Musculoskeletal: Shoulder, elbow, wrist, hip, knee, ankle stable, and with full range of motion.  Impression and Recommendations:    The patient was counselled, risk factors were discussed, anticipatory guidance given.  Well woman exam Annual physical as above. Adding mammogram, she will touch base with Dr. Marice Potter for cervical cancer screening. Checking screening labs.  DDD (degenerative disc disease), cervical Currently seeing a chiropractor. She would like to continue with more complementary and alternative approaches to her neck pain. She does have a TENS unit at home which she will use. I am going to add some cervical spine rehabilitation exercises she can return to see me when she desires. She would like some blood tests looking for total body inflammation.  Irritable bowel syndrome with diarrhea Increase fiber in diet. We discussed the pathophysiology. She will look this up. I do not think she has any food allergies. ___________________________________________ Kathryn Austin. Benjamin Stain, M.D., ABFM., CAQSM. Primary Care and Sports Medicine [facility]  Adjunct Professor of Family Medicine  [employer]

## 2018-07-02 LAB — COMPREHENSIVE METABOLIC PANEL WITH GFR
AG Ratio: 2 (calc) (ref 1.0–2.5)
AST: 13 U/L (ref 10–30)
CO2: 27 mmol/L (ref 20–32)
Chloride: 105 mmol/L (ref 98–110)
Globulin: 2.1 g/dL (ref 1.9–3.7)
Glucose, Bld: 90 mg/dL (ref 65–99)

## 2018-07-02 LAB — CBC
HCT: 35.9 % (ref 35.0–45.0)
Hemoglobin: 12.3 g/dL (ref 11.7–15.5)
MCH: 30.8 pg (ref 27.0–33.0)
MCHC: 34.3 g/dL (ref 32.0–36.0)
MCV: 89.8 fL (ref 80.0–100.0)
MPV: 10.7 fL (ref 7.5–12.5)
Platelets: 254 10*3/uL (ref 140–400)
RBC: 4 Million/uL (ref 3.80–5.10)
RDW: 12.1 % (ref 11.0–15.0)
WBC: 6.8 10*3/uL (ref 3.8–10.8)

## 2018-07-02 LAB — LIPID PANEL W/REFLEX DIRECT LDL
Cholesterol: 151 mg/dL (ref <200)
HDL: 57 mg/dL (ref >50)
LDL Cholesterol (Calc): 81 mg/dL
Non-HDL Cholesterol (Calc): 94 mg/dL (ref <130)
Total CHOL/HDL Ratio: 2.6 (calc) (ref <5.0)
Triglycerides: 53 mg/dL (ref <150)

## 2018-07-02 LAB — TSH: TSH: 1.56 mIU/L

## 2018-07-02 LAB — COMPREHENSIVE METABOLIC PANEL
ALT: 13 U/L (ref 6–29)
Albumin: 4.2 g/dL (ref 3.6–5.1)
Alkaline phosphatase (APISO): 35 U/L (ref 33–115)
BUN: 14 mg/dL (ref 7–25)
Calcium: 8.7 mg/dL (ref 8.6–10.2)
Creat: 0.82 mg/dL (ref 0.50–1.10)
Potassium: 3.8 mmol/L (ref 3.5–5.3)
Sodium: 138 mmol/L (ref 135–146)
Total Bilirubin: 0.8 mg/dL (ref 0.2–1.2)
Total Protein: 6.3 g/dL (ref 6.1–8.1)

## 2018-07-02 LAB — SEDIMENTATION RATE: Sed Rate: 2 mm/h (ref 0–20)

## 2018-07-02 LAB — C-REACTIVE PROTEIN: CRP: 0.3 mg/L (ref <8.0)

## 2018-07-11 ENCOUNTER — Ambulatory Visit (INDEPENDENT_AMBULATORY_CARE_PROVIDER_SITE_OTHER): Payer: Medicaid Other | Admitting: Obstetrics & Gynecology

## 2018-07-11 ENCOUNTER — Encounter: Payer: Self-pay | Admitting: Obstetrics & Gynecology

## 2018-07-11 ENCOUNTER — Other Ambulatory Visit (HOSPITAL_COMMUNITY)
Admission: RE | Admit: 2018-07-11 | Discharge: 2018-07-11 | Disposition: A | Discharge status: Home / Self Care | Payer: Medicaid Other | Source: Ambulatory Visit | Attending: Obstetrics & Gynecology | Admitting: Obstetrics & Gynecology

## 2018-07-11 VITALS — BP 115/70 | HR 68 | Resp 16 | Ht 65.0 in | Wt 126.0 lb

## 2018-07-11 DIAGNOSIS — Z01419 Encounter for gynecological examination (general) (routine) without abnormal findings: Secondary | ICD-10-CM | POA: Diagnosis present

## 2018-07-11 DIAGNOSIS — Z0000 Encounter for general adult medical examination without abnormal findings: Secondary | ICD-10-CM | POA: Diagnosis not present

## 2018-07-11 NOTE — Progress Notes (Signed)
Subjective:    [name] is a 42 y.o. married P2 (12 and 42 yo kids)female who presents for an annual exam. The patient has no complaints today. The patient is sexually active. GYN screening history: last pap: was normal. The patient wears seatbelts: yes. The patient participates in regular exercise: yes. 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 The patient reports that there is not domestic violence in her life.   Menstrual History: OB History    Gravida  3   Para  2   Term  2   Preterm      AB  1   Living  2     SAB      TAB      Ectopic      Multiple      Live Births  1           Menarche age: 5 Patient's last menstrual period was 06/30/2018.    The following portions of the patient's history were reviewed and updated as appropriate: allergies, current medications, past family history, past medical history, past social history, past surgical history and problem list.  Review of Systems Pertinent items are noted in HPI.   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 She has a mammogram scheduled next week    Objective:    BP 115/70   Pulse 68   Resp 16   Ht 5\' 5"  (1.651 m)   Wt 126 lb (57.2 kg)   LMP 06/30/2018   BMI 20.97 kg/m   General Appearance:    Alert, cooperative, no distress, appears stated age  Head:    Normocephalic, without obvious abnormality, atraumatic  Eyes:    PERRL, conjunctiva/corneas clear, EOM's intact, fundi    benign, both eyes  Ears:    Normal TM's and external ear canals, both ears  Nose:   Nares normal, septum midline, mucosa normal, no drainage    or sinus tenderness  Throat:   Lips, mucosa, and tongue normal; teeth and gums normal  Neck:   Supple, symmetrical, trachea midline, no adenopathy;    thyroid:  no  enlargement/tenderness/nodules; no carotid   bruit or JVD  Back:     Symmetric, no curvature, ROM normal, no CVA tenderness  Lungs:     Clear to auscultation bilaterally, respirations unlabored  Chest Wall:    No tenderness or deformity   Heart:    Regular rate and rhythm, S1 and S2 normal, no murmur, rub   or gallop  Breast Exam:    No tenderness, masses, or nipple abnormality, implants present     Soft, non-tender, bowel sounds active all four quadrants,    no masses, no organomegaly  Genitalia:    Normal female without lesion, discharge or tenderness, normal size and shape, anteverted, mobile, non-tender, normal adnexal exam      Extremities:   Extremities normal, atraumatic, no cyanosis or edema  Pulses:   2+ and symmetric all extremities  Skin:   Skin color, texture, turgor normal, no rashes or lesions  Lymph nodes:   Cervical, supraclavicular, and axillary nodes normal  Neurologic:   CNII-XII intact, normal strength, sensation and reflexes    throughout  .    Assessment:    Healthy female exam.    Plan:     Thin  prep Pap smear. with cotesting

## 2018-07-13 ENCOUNTER — Ambulatory Visit (INDEPENDENT_AMBULATORY_CARE_PROVIDER_SITE_OTHER): Payer: Self-pay

## 2018-07-13 DIAGNOSIS — Z1231 Encounter for screening mammogram for malignant neoplasm of breast: Secondary | ICD-10-CM

## 2018-07-13 LAB — CYTOLOGY - PAP
DIAGNOSIS: NEGATIVE
HPV (WINDOPATH): NOT DETECTED

## 2018-09-12 ENCOUNTER — Encounter: Payer: Self-pay | Admitting: Family Medicine

## 2018-09-12 ENCOUNTER — Ambulatory Visit (INDEPENDENT_AMBULATORY_CARE_PROVIDER_SITE_OTHER): Payer: Self-pay

## 2018-09-12 ENCOUNTER — Ambulatory Visit (INDEPENDENT_AMBULATORY_CARE_PROVIDER_SITE_OTHER): Payer: Self-pay | Admitting: Family Medicine

## 2018-09-12 VITALS — BP 111/87 | HR 86 | Temp 98.6°F | Ht 66.0 in | Wt 127.0 lb

## 2018-09-12 DIAGNOSIS — R05 Cough: Secondary | ICD-10-CM

## 2018-09-12 DIAGNOSIS — J181 Lobar pneumonia, unspecified organism: Secondary | ICD-10-CM

## 2018-09-12 DIAGNOSIS — R0989 Other specified symptoms and signs involving the circulatory and respiratory systems: Secondary | ICD-10-CM

## 2018-09-12 DIAGNOSIS — J189 Pneumonia, unspecified organism: Secondary | ICD-10-CM

## 2018-09-12 DIAGNOSIS — R0602 Shortness of breath: Secondary | ICD-10-CM

## 2018-09-12 DIAGNOSIS — R059 Cough, unspecified: Secondary | ICD-10-CM

## 2018-09-12 MED ORDER — BENZONATATE 200 MG PO CAPS: 200.0000 mg | ORAL_CAPSULE | Freq: Three times a day (TID) | ORAL | 0 refills | Status: DC | PRN

## 2018-09-12 MED ORDER — AZITHROMYCIN 250 MG PO TABS: 250.0000 mg | ORAL_TABLET | Freq: Every day | ORAL | 0 refills | Status: DC

## 2018-09-12 NOTE — Patient Instructions (Signed)
Thank you for coming in today. I think this is viral bronchitis. Take over the counter medicine like tylenol or fever, chills, body aches.  Take tessalon pearles for cough as needed.   I will get the results of the chest xray to you.  If the radiologists are concerned for pneumonia I will prescribe antibiotics.  Let me know if not better or if worse.    Acute Bronchitis, Adult  Acute bronchitis is sudden (acute) swelling of the air tubes (bronchi) in the lungs. Acute bronchitis causes these tubes to fill with mucus, which can make it hard to breathe. It can also cause coughing or wheezing. In adults, acute bronchitis usually goes away within 2 weeks. A cough caused by bronchitis may last up to 3 weeks. Smoking, allergies, and asthma can make the condition worse. Repeated episodes of bronchitis may cause further lung problems, such as chronic obstructive pulmonary disease (COPD). What are the causes? This condition can be caused by germs and by substances that irritate the lungs, including:  Cold and flu viruses. This condition is most often caused by the same virus that causes a cold.  Bacteria.  Exposure to tobacco smoke, dust, fumes, and air pollution. What increases the risk? This condition is more likely to develop in people who:  Have close contact with someone with acute bronchitis.  Are exposed to lung irritants, such as tobacco smoke, dust, fumes, and vapors.  Have a weak immune system.  Have a respiratory condition such as asthma. What are the signs or symptoms? Symptoms of this condition include:  A cough.  Coughing up clear, yellow, or green mucus.  Wheezing.  Chest congestion.  Shortness of breath.  A fever.  Body aches.  Chills.  A sore throat. How is this diagnosed? This condition is usually diagnosed with a physical exam. During the exam, your health care provider may order tests, such as chest X-rays, to rule out other conditions. He or she may  also:  Test a sample of your mucus for bacterial infection.  Check the level of oxygen in your blood. This is done to check for pneumonia.  Do a chest X-ray or lung function testing to rule out pneumonia and other conditions.  Perform blood tests. Your health care provider will also ask about your symptoms and medical history. How is this treated? Most cases of acute bronchitis clear up over time without treatment. Your health care provider may recommend:  Drinking more fluids. Drinking more makes your mucus thinner, which may make it easier to breathe.  Taking a medicine for a fever or cough.  Taking an antibiotic medicine.  Using an inhaler to help improve shortness of breath and to control a cough.  Using a cool mist vaporizer or humidifier to make it easier to breathe. Follow these instructions at home: Medicines  Take over-the-counter and prescription medicines only as told by your health care provider.  If you were prescribed an antibiotic, take it as told by your health care provider. Do not stop taking the antibiotic even if you start to feel better. General instructions   Get plenty of rest.  Drink enough fluids to keep your urine pale yellow.  Avoid smoking and secondhand smoke. Exposure to cigarette smoke or irritating chemicals will make bronchitis worse. If you smoke and you need help quitting, ask your health care provider. Quitting smoking will help your lungs heal faster.  Use an inhaler, cool mist vaporizer, or humidifier as told by your health care provider.  Keep all follow-up visits as told by your health care provider. This is important. How is this prevented? To lower your risk of getting this condition again:  Wash your hands often with soap and water. If soap and water are not available, use hand sanitizer.  Avoid contact with people who have cold symptoms.  Try not to touch your hands to your mouth, nose, or eyes.  Make sure to get the flu  shot every year. Contact a health care provider if:  Your symptoms do not improve in 2 weeks of treatment. Get help right away if:  You cough up blood.  You have chest pain.  You have severe shortness of breath.  You become dehydrated.  You faint or keep feeling like you are going to faint.  You keep vomiting.  You have a severe headache.  Your fever or chills gets worse. This information is not intended to replace advice given to you by your health care provider. Make sure you discuss any questions you have with your health care provider. Document Released: 10/08/2004 Document Revised: 04/14/2017 Document Reviewed: 02/19/2016 Elsevier Interactive Patient Education  2019 [employer]

## 2018-09-12 NOTE — Progress Notes (Signed)
Kathryn HartJennifer Barber is a 42 y.o. female who [facility] today for cough congestion fevers and chills.  Patient became ill about a week and a half ago with cough and congestion and runny nose.  She was doing well until about 2 days ago when she worsened again developing a productive cough and worsening fevers and chills.  She is not tried much medicines yet.  She has tried some Mucinex which did not help much.  She notes the cough is not interfering with sleep.  No vomiting or diarrhea chest pain or palpitation symptoms.   ROS as above:  Exam:  BP 111/87   Pulse 86   Temp 98.6 F (37 C) (Oral)   Ht 5\' 6"  (1.676 m)   Wt 127 lb (57.6 kg)   LMP 09/12/2018   BMI 20.50 kg/m  Wt Readings from Last 5 Encounters:  09/12/18 127 lb (57.6 kg)  07/11/18 126 lb (57.2 kg)  06/28/18 125 lb (56.7 kg)  01/18/17 129 lb (58.5 kg)  10/08/16 131 lb (59.4 kg)    Gen: Well NAD HEENT: EOMI,  MMM clear nasal discharge.  Normal posterior pharynx and tympanic membranes.  Mild cervical lymphadenopathy. Lungs: Normal work of breathing. CTABL Heart: RRR no MRG Abd: NABS, Soft. Nondistended, Nontender Exts: Brisk capillary refill, warm and well perfused.   Lab and Radiology Results  Dg Chest 2 View  Result Date: 09/12/2018 CLINICAL DATA:  Onset of cough, chest congestion, and shortness of breath 2 weeks ago with onset of chills and low-grade fever 2 days ago. Never smoked. EXAM: CHEST - 2 VIEW COMPARISON:  None. FINDINGS: The lungs are mildly hyperinflated. There is an infiltrate in the posteromedial aspect of the left lower lobe. The right lung is clear. The heart and mediastinal structures are normal. The bony thorax is unremarkable. There are bilateral breast implants present. IMPRESSION: Left lower lobe pneumonia. Followup PA and lateral chest X-ray is recommended in 3-4 weeks  following trial of antibiotic therapy to ensure resolution and exclude underlying malignancy. Electronically Signed   By: David  Swazil[place]Jordan M.D.   On: 09/12/2018 09:41  I personally (independently) visualized and performed the interpretation of the images attached in this note.    Assessment and Plan: 42 y.o. female with cough likely viral.  Patient does have some second sickening symptoms.  X-ray read came back after patient was discharged.  My read was more concerning for bronchitis.  Plan to prescribe azithromycin antibiotics.  Patient is allergic to penicillins and would like to avoid fluoroquinolones.  I called patient to discuss results.   Orders Placed This Encounter  Procedures  . DG Chest 2 View    Order Specific Question:   Reason for exam:    Answer:   Cough, assess intra-thoracic pathology    Order Specific Question:   Is the patient pregnant?    Answer:   No    Order Specific Question:   Preferred imaging location?    Answer[facility]   Meds ordered this encounter  Medications  . benzonatate (TESSALON) 200 MG capsule    Sig: Take 1 capsule (200 mg total) by mouth 3 (three) times daily as needed for cough.    Dispense:  45 capsule    Refill:  0  . azithromycin (ZITHROMAX) 250 MG tablet    Sig: Take 1 tablet (250 mg total) by mouth daily. Take first 2 tablets together, then  1 every day until finished.    Dispense:  6 tablet    Refill:  0     Historical information moved to improve visibility of documentation.  Past Medical History:  Diagnosis Date  . Broken foot    Right  . Cat allergies   . No pertinent past medical history   . PONV (postoperative nausea and vomiting)   . Seasonal allergies   . Spinal headache    Past Surgical History:  Procedure Laterality Date  . AUGMENTATION MAMMAPLASTY    . BREAST ENHANCEMENT SURGERY    . KNEE SURGERY    . LASIK Bilateral 7/14  . NASAL SEPTUM SURGERY    . WISDOM TOOTH EXTRACTION     Social History    Tobacco Use  . Smoking status: Never Smoker  . Smokeless tobacco: Never Used  Substance Use Topics  . Alcohol use: No   family history includes Heart disease in her father.  Medications: Current Outpatient Medications  Medication Sig Dispense Refill  . b complex vitamins capsule Take 1 capsule by mouth daily.    . fluticasone (FLONASE) 50 MCG/ACT nasal spray Place 2 sprays into both nostrils daily. 16 g 6  . Glucosamine HCl-MSM (GLUCOSAMINE-MSM PO) Take by mouth.    Marland Kitchen. ketotifen (ZADITOR) 0.025 % ophthalmic solution 1 drop 2 (two) times daily.    . magnesium 30 MG tablet Take 30 mg by mouth 2 (two) times daily.    . Multiple Vitamins-Minerals (MULTIVITAMIN WITH MINERALS) tablet Take 1 tablet by mouth daily.    . Omega-3 Fatty Acids (FISH OIL) 1000 MG CAPS Take by mouth.    Marland Kitchen. Specialty Vitamins Products (BIOTIN PLUS KERATIN PO) Take by mouth.    Marland Kitchen. azithromycin (ZITHROMAX) 250 MG tablet Take 1 tablet (250 mg total) by mouth daily. Take first 2 tablets together, then 1 every day until finished. 6 tablet 0  . benzonatate (TESSALON) 200 MG capsule Take 1 capsule (200 mg total) by mouth 3 (three) times daily as needed for cough. 45 capsule 0   No current facility-administered medications for this visit.    Allergies  Allergen Reactions  . Penicillins     REACTION: Rash     Discussed warning signs or symptoms. Please see discharge instructions. Patient expresses understanding.

## 2019-07-17 ENCOUNTER — Encounter: Payer: Self-pay | Admitting: *Deleted

## 2019-08-18 ENCOUNTER — Telehealth: Payer: Self-pay | Admitting: *Deleted

## 2019-08-18 NOTE — Telephone Encounter (Signed)
Pt called and left vm asking for a letter of exemption so she does not have to wear a mask during exercising.  She states that she gets out of breath wearing them and it increases her anxiety.  Spoke with provider about this and was told that mask wearing during exercise was more beneficial than not wearing one.  Advised pt of this.  No other questions at this time.

## 2019-08-18 NOTE — Telephone Encounter (Signed)
Mask wear can potentially be beneficial, some athletes use altitude training masks which stimulate training at high altitude.  Because of the mask the respiratory muscles are also subjected to a greater load which ultimately can improve their functional capacity, and the same way as other muscles can increase in strength when subjected to increased loads.  The slight increase in inhaled CO2 can also stimulate training at a high altitude which is sometimes done by Olympic athletes.

## 2020-02-08 ENCOUNTER — Telehealth: Payer: Self-pay

## 2020-02-08 NOTE — Telephone Encounter (Signed)
I have not seen this patient in almost 2 years, we would need a visit to discuss where things are currently and what a new treatment plan would be.

## 2020-02-08 NOTE — Telephone Encounter (Signed)
Pt LVM stating that the place she mentioned earlier for PT does not accept Medicaid patients over 44 years old. She states the referral needs to go to [facility] at [address] in [place].

## 2020-02-09 NOTE — Telephone Encounter (Signed)
Spoke with pt and informed her that she will need an OV for this referral and she stated that she currently has Medicaid and will be getting commercial insurance next month and we are not in network with her insurance company. She stated she would have to establish care with a new PCP that is in network for her, therefore she will wait to see them to get the referral.

## 2021-03-26 ENCOUNTER — Inpatient Hospital Stay
Admit: 2021-03-26 | Discharge: 2021-03-26 | Disposition: A | Discharge status: Home / Self Care | Payer: Medicaid (Managed Care) | Attending: Emergency Medicine

## 2021-03-26 ENCOUNTER — Inpatient Hospital Stay: Admit: 2021-03-26 | Payer: Medicaid (Managed Care) | Primary: Adult Health

## 2021-03-26 DIAGNOSIS — S161XXA Strain of muscle, fascia and tendon at neck level, initial encounter: Secondary | ICD-10-CM

## 2021-03-26 NOTE — ED Notes (Signed)
Pt @ imaging      Arlyn Dunning, RN  03/26/21 1010

## 2021-03-26 NOTE — Discharge Instructions (Signed)
Take your medications as prescribed.  Return if worsening in any way.  Follow-up with your spine specialist back home.

## 2021-03-26 NOTE — ED Notes (Signed)
Pt to CT     Arlyn Dunning, RN  03/26/21 1030
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
## 2021-03-26 NOTE — ED Notes (Signed)
I have reviewed discharge instructions with the patient.  The patient verbalized understanding.    Patient left ED via Discharge Method: ambulatory to Home with self.     Opportunity for questions and clarification provided.       Patient given 0 scripts.         To continue your aftercare when you leave the hospital, you may receive an automated call from our care team to check in on how you are doing.  This is a free service and part of our promise to provide the best care and service to meet your aftercare needs.??? If you have questions, or wish to unsubscribe from this service please call [phone].  Thank you for Choosing our [facility] Emergency Department.        Arlyn Dunning, RN  03/26/21 1119
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
## 2021-09-29 ENCOUNTER — Ambulatory Visit (INDEPENDENT_AMBULATORY_CARE_PROVIDER_SITE_OTHER): Payer: Medicaid Other | Admitting: Family Medicine

## 2021-09-29 ENCOUNTER — Encounter: Payer: Self-pay | Admitting: Family Medicine

## 2021-09-29 ENCOUNTER — Other Ambulatory Visit: Payer: Self-pay

## 2021-09-29 ENCOUNTER — Other Ambulatory Visit (HOSPITAL_COMMUNITY)
Admission: RE | Admit: 2021-09-29 | Discharge: 2021-09-29 | Disposition: A | Discharge status: Home / Self Care | Payer: Medicaid Other | Source: Ambulatory Visit | Attending: Family Medicine | Admitting: Family Medicine

## 2021-09-29 VITALS — BP 117/71 | HR 80 | Resp 16 | Ht 65.0 in | Wt 138.0 lb

## 2021-09-29 DIAGNOSIS — Z124 Encounter for screening for malignant neoplasm of cervix: Secondary | ICD-10-CM | POA: Diagnosis not present

## 2021-09-29 DIAGNOSIS — Z01419 Encounter for gynecological examination (general) (routine) without abnormal findings: Secondary | ICD-10-CM

## 2021-09-29 NOTE — Patient Instructions (Signed)
Preventive Care 21-46 Years Old, Female °Preventive care refers to lifestyle choices and visits with your health care provider that can promote health and wellness. Preventive care visits are also called wellness exams. °What can I expect for my preventive care visit? °Counseling °During your preventive care visit, your health care provider may ask about your: °Medical history, including: °Past medical problems. °Family medical history. °Pregnancy history. °Current health, including: °Menstrual cycle. °Method of birth control. °Emotional well-being. °Home life and relationship well-being. °Sexual activity and sexual health. °Lifestyle, including: °Alcohol, nicotine or tobacco, and drug use. °Access to firearms. °Diet, exercise, and sleep habits. °Work and work environment. °Sunscreen use. °Safety issues such as seatbelt and bike helmet use. °Physical exam °Your health care provider may check your: °Height and weight. These may be used to calculate your BMI (body mass index). BMI is a measurement that tells if you are at a healthy weight. °Waist circumference. This measures the distance around your waistline. This measurement also tells if you are at a healthy weight and may help predict your risk of certain diseases, such as type 2 diabetes and high blood pressure. °Heart rate and blood pressure. °Body temperature. °Skin for abnormal spots. °What immunizations do I need? °Vaccines are usually given at various ages, according to a schedule. Your health care provider will recommend vaccines for you based on your age, medical history, and lifestyle or other factors, such as travel or where you work. °What tests do I need? °Screening °Your health care provider may recommend screening tests for certain conditions. This may include: °Pelvic exam and Pap test. °Lipid and cholesterol levels. °Diabetes screening. This is done by checking your blood sugar (glucose) after you have not eaten for a while (fasting). °Hepatitis B  test. °Hepatitis C test. °HIV (human immunodeficiency virus) test. °STI (sexually transmitted infection) testing, if you are at risk. °BRCA-related cancer screening. This may be done if you have a family history of breast, ovarian, tubal, or peritoneal cancers. °Talk with your health care provider about your test results, treatment options, and if necessary, the need for more tests. °Follow these instructions at home: °Eating and drinking ° °Eat a healthy diet that includes fresh fruits and vegetables, whole grains, lean protein, and low-fat dairy products. °Take vitamin and mineral supplements as recommended by your health care provider. °Do not drink alcohol if: °Your health care provider tells you not to drink. °You are pregnant, may be pregnant, or are planning to become pregnant. °If you drink alcohol: °Limit how much you have to 0-1 drink a day. °Know how much alcohol is in your drink. In the [place] one drink equals one 12 oz bottle of beer (355 mL), one 5 oz glass of wine (148 mL), or one 1½ oz glass of hard liquor (44 mL). °Lifestyle °Brush your teeth every morning and night with fluoride toothpaste. Floss one time each day. °Exercise for at least 30 minutes 5 or more days each week. °Do not use any products that contain nicotine or tobacco. These products include cigarettes, chewing tobacco, and vaping devices, such as e-cigarettes. If you need help quitting, ask your health care provider. °Do not use drugs. °If you are sexually active, practice safe sex. Use a condom or other form of protection to prevent STIs. °If you do not wish to become pregnant, use a form of birth control. If you plan to become pregnant, see your health care provider for a prepregnancy visit. °Find healthy ways to manage stress, such as: °Meditation, yoga,   or listening to music. °Journaling. °Talking to a trusted person. °Spending time with friends and family. °Minimize exposure to UV radiation to reduce your risk of skin  cancer. °Safety °Always wear your seat belt while driving or riding in a vehicle. °Do not drive: °If you have been drinking alcohol. Do not ride with someone who has been drinking. °If you have been using any mind-altering substances or drugs. °While texting. °When you are tired or distracted. °Wear a helmet and other protective equipment during sports activities. °If you have firearms in your house, make sure you follow all gun safety procedures. °Seek help if you have been physically or sexually abused. °What's next? °Go to your health care provider once a year for an annual wellness visit. °Ask your health care provider how often you should have your eyes and teeth checked. °Stay up to date on all vaccines. °This information is not intended to replace advice given to you by your health care provider. Make sure you discuss any questions you have with your health care provider. °Document Revised: 02/26/2021 Document Reviewed: 02/26/2021 °Elsevier Patient Education © 2022 [employer] ° °

## 2021-09-29 NOTE — Progress Notes (Signed)
Subjective:     [name] is a 46 y.o. female and is here for a comprehensive physical exam. The patient reports problems - increasing anxiety . Cycles are normal, come regularly. Coming closer together. Having some hot flashes. Cycles are lighter and having some night wakening, drenched in sweat.   The following portions of the patient's history were reviewed and updated as appropriate: allergies, current medications, past family history, past medical history, past social history, past surgical history, and problem list.  Review of Systems Pertinent items noted in HPI and remainder of comprehensive ROS otherwise negative.   Objective:  Chaperone present for exam   BP 117/71    Pulse 80    Resp 16    Ht 5\' 5"  (1.651 m)    Wt 138 lb (62.6 kg)    LMP 09/17/2021    BMI 22.96 kg/m  General appearance: alert, cooperative, and appears stated age Head: Normocephalic, without obvious abnormality, atraumatic Neck: no adenopathy, supple, symmetrical, trachea midline, and thyroid not enlarged, symmetric, no tenderness/mass/nodules Lungs: clear to auscultation bilaterally Breasts: normal appearance, no masses or tenderness, bilateral implants noted Heart: regular rate and rhythm, S1, S2 normal, no murmur, click, rub or gallop Abdomen: soft, non-tender; bowel sounds normal; no masses,  no organomegaly Pelvic: cervix normal in appearance, external genitalia normal, no adnexal masses or tenderness, no cervical motion tenderness, uterus normal size, shape, and consistency, and vagina normal without discharge Extremities: extremities normal, atraumatic, no cyanosis or edema Pulses: 2+ and symmetric Skin: Skin color, texture, turgor normal. No rashes or lesions Lymph nodes: Cervical, supraclavicular, and axillary nodes normal. Neurologic: Grossly normal    Assessment:    Healthy female exam.      Plan:    Screening for malignant neoplasm of cervix - Plan: Cytology - PAP( [facility])  Encounter for gynecological examination without abnormal finding - would like blood work - Plan: CBC, Comprehensive metabolic panel, Hemoglobin A1c, Lipid panel, CANCELED: CBC, CANCELED: Comprehensive metabolic panel, CANCELED: Hemoglobin A1c, CANCELED: Lipid panel  Return in 1 year (on 09/29/2022).  See After Visit Summary for Counseling Recommendations

## 2021-09-30 ENCOUNTER — Other Ambulatory Visit (HOSPITAL_BASED_OUTPATIENT_CLINIC_OR_DEPARTMENT_OTHER): Payer: Self-pay

## 2021-09-30 ENCOUNTER — Other Ambulatory Visit (HOSPITAL_BASED_OUTPATIENT_CLINIC_OR_DEPARTMENT_OTHER): Payer: Self-pay | Admitting: Family Medicine

## 2021-09-30 DIAGNOSIS — Z1231 Encounter for screening mammogram for malignant neoplasm of breast: Secondary | ICD-10-CM

## 2021-09-30 NOTE — Addendum Note (Signed)
Addended by: Lyndal Rainbow on: 09/30/2021 09:11 AM   Modules accepted: Orders

## 2021-09-30 NOTE — Addendum Note (Signed)
Addended by: Kathie Dike on: 09/30/2021 09:09 AM   Modules accepted: Orders

## 2021-10-01 LAB — CYTOLOGY - PAP
Adequacy: ABSENT
Comment: NEGATIVE
Diagnosis: NEGATIVE
High risk HPV: NEGATIVE

## 2021-10-02 ENCOUNTER — Ambulatory Visit (INDEPENDENT_AMBULATORY_CARE_PROVIDER_SITE_OTHER): Payer: Medicaid Other

## 2021-10-02 ENCOUNTER — Other Ambulatory Visit: Payer: Self-pay

## 2021-10-02 DIAGNOSIS — Z1231 Encounter for screening mammogram for malignant neoplasm of breast: Secondary | ICD-10-CM | POA: Diagnosis not present

## 2021-10-03 LAB — CBC
HCT: 38.3 % (ref 35.0–45.0)
Hemoglobin: 12.8 g/dL (ref 11.7–15.5)
MCH: 31.3 pg (ref 27.0–33.0)
MCHC: 33.4 g/dL (ref 32.0–36.0)
MCV: 93.6 fL (ref 80.0–100.0)
MPV: 11 fL (ref 7.5–12.5)
Platelets: 229 10*3/uL (ref 140–400)
RBC: 4.09 10*6/uL (ref 3.80–5.10)
RDW: 11.8 % (ref 11.0–15.0)
WBC: 5.4 10*3/uL (ref 3.8–10.8)

## 2021-10-03 LAB — COMPREHENSIVE METABOLIC PANEL
AG Ratio: 2.2 (calc) (ref 1.0–2.5)
ALT: 15 U/L (ref 6–29)
AST: 16 U/L (ref 10–35)
Albumin: 4.3 g/dL (ref 3.6–5.1)
Alkaline phosphatase (APISO): 31 U/L (ref 31–125)
BUN: 12 mg/dL (ref 7–25)
CO2: 29 mmol/L (ref 20–32)
Calcium: 8.6 mg/dL (ref 8.6–10.2)
Chloride: 102 mmol/L (ref 98–110)
Creat: 0.72 mg/dL (ref 0.50–0.99)
Globulin: 2 g/dL (calc) (ref 1.9–3.7)
Glucose, Bld: 85 mg/dL (ref 65–139)
Potassium: 3.7 mmol/L (ref 3.5–5.3)
Sodium: 136 mmol/L (ref 135–146)
Total Bilirubin: 0.9 mg/dL (ref 0.2–1.2)
Total Protein: 6.3 g/dL (ref 6.1–8.1)

## 2021-10-03 LAB — HEMOGLOBIN A1C
Hgb A1c MFr Bld: 5.2 % of total Hgb (ref <5.7)
Mean Plasma Glucose: 103 mg/dL
eAG (mmol/L): 5.7 mmol/L

## 2021-10-03 LAB — LIPID PANEL
Cholesterol: 158 mg/dL (ref <200)
HDL: 67 mg/dL (ref >=50)
LDL Cholesterol (Calc): 76 mg/dL (calc)
Non-HDL Cholesterol (Calc): 91 mg/dL (calc) (ref <130)
Total CHOL/HDL Ratio: 2.4 (calc) (ref <5.0)
Triglycerides: 72 mg/dL (ref <150)

## 2022-05-14 ENCOUNTER — Other Ambulatory Visit: Payer: Self-pay | Admitting: Physician Assistant

## 2022-05-14 DIAGNOSIS — R102 Pelvic and perineal pain: Secondary | ICD-10-CM

## 2022-05-21 ENCOUNTER — Ambulatory Visit (INDEPENDENT_AMBULATORY_CARE_PROVIDER_SITE_OTHER): Payer: Medicaid Other

## 2022-05-21 DIAGNOSIS — R102 Pelvic and perineal pain: Secondary | ICD-10-CM

## 2022-10-13 ENCOUNTER — Telehealth: Payer: Self-pay

## 2022-10-13 NOTE — Telephone Encounter (Signed)
Transition Care Management Unsuccessful Follow-up Telephone Call  Date of discharge and from where:  [name] 10/12/2022  Attempts:  1st Attempt  Reason for unsuccessful TCM follow-up call:  Left voice message [name], [place] Direct Dial [phone]

## 2022-10-14 NOTE — Telephone Encounter (Signed)
Transition Care Management Follow-up Telephone Call Date of discharge and from where: [name] 10/12/2022 How have you been since you were released from the hospital? good Any questions or concerns? No  Items Reviewed: Did the pt receive and understand the discharge instructions provided? Yes  Medications obtained and verified? Yes  Other? No  Any new allergies since your discharge? No  Dietary orders reviewed? Yes Do you have support at home? Yes   Home Care and Equipment/Supplies: Were home health services ordered? no If so, what is the name of the agency? N/a  Has the agency set up a time to come to the patient's home? no Were any new equipment or medical supplies ordered?  No What is the name of the medical supply agency? N/a Were you able to get the supplies/equipment? no Do you have any questions related to the use of the equipment or supplies? No  Functional Questionnaire: (I = Independent and D = Dependent) ADLs: I  Bathing/Dressing- I  Meal Prep- I  Eating- I  Maintaining continence- I  Transferring/Ambulation- I  Managing Meds- I  Follow up appointments reviewed:  PCP Hospital f/u appt confirmed? No  Specialist Hospital f/u appt confirmed? Yes  Scheduled to see Threasa Beards on 10/21/2022  Are transportation arrangements needed? No  If their condition worsens, is the pt aware to call PCP or go to the Emergency Dept.? Yes Was the patient provided with contact information for the PCP's office or ED? Yes Was to pt encouraged to call back with questions or concerns? Yes [name], LPN [place] Direct Dial [phone]

## 2023-03-09 ENCOUNTER — Other Ambulatory Visit: Payer: Self-pay | Admitting: Obstetrics and Gynecology

## 2023-03-09 DIAGNOSIS — Z1231 Encounter for screening mammogram for malignant neoplasm of breast: Secondary | ICD-10-CM

## 2023-04-05 NOTE — Progress Notes (Deleted)
   ANNUAL EXAM Patient name: Rozann Holts MRN [id number]  Date of birth: 01-Sep-1976 Chief Complaint:   No chief complaint on file.  History of Present Illness:   Jalayia Bagheri is a 47 y.o. [phone] female being seen today for a routine annual exam.   Current complaints: ***  Current birth control: ***  No LMP recorded.   Last MXR: scheduled for 03/2023 Last Pap/Pap History: 09/2021. Results were: NILM w/ HRHPV negative. H/O abnormal pap: {yes/yes***/no:23866}   Health Maintenance Due  Topic Date Due   Hepatitis C Screening  Never done   COVID-19 Vaccine (1 - 2023-24 season) Never done    Review of Systems:   Pertinent items are noted in HPI Denies any headaches, blurred vision, fatigue, shortness of breath, chest pain, abdominal pain, abnormal vaginal discharge/itching/odor/irritation, problems with periods, bowel movements, urination, or intercourse unless otherwise stated above. *** Pertinent History Reviewed:  Reviewed past medical,surgical, social and family history.  Reviewed problem list, medications and allergies. Physical Assessment:  There were no vitals filed for this visit.There is no height or weight on file to calculate BMI.   Physical Examination:  General appearance - well appearing, and in no distress Mental status - alert, oriented to person, place, and time Psych:  She has a normal mood and affect Skin - warm and dry, normal color, no suspicious lesions noted Chest - effort normal Heart - normal rate  Breasts - breasts appear normal, no suspicious masses, no skin or nipple changes or axillary nodes Abdomen - soft, nontender, nondistended, no masses or organomegaly Pelvic -  VULVA: normal appearing vulva with no masses, tenderness or lesions  VAGINA: normal appearing vagina with normal color and discharge, no lesions  CERVIX: normal appearing cervix without discharge or lesions, no CMT UTERUS: uterus is felt to be normal size, shape, consistency and  nontender  ADNEXA: No adnexal masses or tenderness noted. Extremities:  No swelling or varicosities noted  Chaperone present for exam  No results found for this or any previous visit (from the past 24 hour(s)).  Assessment & Plan:  Diagnoses and all orders for this visit:  Encounter for annual routine gynecological examination  Perimenopause   - Cervical cancer screening: Discussed guidelines. Pap with HPV 09/2021.  - STD Testing: {Blank single:19197::"accepts","declines","not indicated"} - Birth Control: Discussed options and their risks, benefits and common side effects; discussed VTE with estrogen containing options. Desires: {Birth control type:23956} - Breast Health: Encouraged self breast awareness/SBE. Teaching provided. Discussed limits of clinical breast exam for detecting breast cancer.  Scheduled for 03/2023. - F/U 12 months and prn     No orders of the defined types were placed in this encounter.   Meds: No orders of the defined types were placed in this encounter.   Follow-up: No follow-ups on file.  Milas Hock, MD 04/05/2023 3:38 PM

## 2023-04-07 ENCOUNTER — Ambulatory Visit: Payer: Medicaid Other

## 2023-04-07 ENCOUNTER — Ambulatory Visit: Payer: Medicaid Other | Admitting: Obstetrics and Gynecology

## 2023-04-22 ENCOUNTER — Encounter: Payer: Self-pay | Admitting: Obstetrics and Gynecology

## 2023-04-22 ENCOUNTER — Ambulatory Visit (INDEPENDENT_AMBULATORY_CARE_PROVIDER_SITE_OTHER): Payer: Medicaid Other | Admitting: Obstetrics and Gynecology

## 2023-04-22 VITALS — BP 116/87 | HR 85 | Ht 65.0 in | Wt 148.0 lb

## 2023-04-22 DIAGNOSIS — Z01419 Encounter for gynecological examination (general) (routine) without abnormal findings: Secondary | ICD-10-CM | POA: Diagnosis not present

## 2023-04-22 DIAGNOSIS — N951 Menopausal and female climacteric states: Secondary | ICD-10-CM

## 2023-04-22 MED ORDER — JUNEL FE 24 1-20 MG-MCG(24) PO TABS: 1.0000 | ORAL_TABLET | Freq: Every day | ORAL | 3 refills | Status: DC

## 2023-04-22 NOTE — Progress Notes (Signed)
ANNUAL EXAM Patient name: [name] MRN [id number]  Date of birth: 1976-07-30 Chief Complaint:   Annual Exam  History of Present Illness:   [name] is a 47 y.o. [id number] being seen today for a routine annual exam. Patient's last menstrual period was 04/03/2023.  Perimenopausal symptoms.   Upstream - 04/25/23 1627       Pregnancy Intention Screening   Does the patient want to become pregnant in the next year? No    Does the patient's partner want to become pregnant in the next year? No    Would the patient like to discuss contraceptive options today? No      Contraception Wrap Up   Current Method Vasectomy    End Method Vasectomy    Contraception Counseling Provided No    How was the end contraceptive method provided? N/A            The pregnancy intention screening data noted above was reviewed. Potential methods of contraception were discussed. The patient elected to proceed with Vasectomy.   Last pap 09/29/21. Results were: NILM w/ HRHPV negative Last mammogram: 10/02/21. Results were: normal Last colonoscopy: 07/17/2019. Next due in 10 years     06/28/2018    1:58 PM  Depression screen PHQ 2/9  Decreased Interest 0  Down, Depressed, Hopeless 1  PHQ - 2 Score 1  Altered sleeping 0  Tired, decreased energy 0  Change in appetite 0  Feeling bad or failure about yourself  1  Trouble concentrating 2  Moving slowly or fidgety/restless 1  Suicidal thoughts 0  PHQ-9 Score 5  Difficult doing work/chores Somewhat difficult        06/28/2018    2:00 PM  GAD 7 : Generalized Anxiety Score  Nervous, Anxious, on Edge 2  Control/stop worrying 1  Worry too much - different things 0  Trouble relaxing 2  Restless 2  Easily annoyed or irritable 1  Afraid - awful might happen 0  Total GAD 7 Score 8  Anxiety Difficulty Somewhat difficult     Review of Systems:   Pertinent items are noted in HPI Denies any headaches, blurred vision, fatigue, shortness of  breath, chest pain, abdominal pain, abnormal vaginal discharge/itching/odor/irritation, problems with periods, bowel movements, urination, or intercourse unless otherwise stated above. Pertinent History Reviewed:  Reviewed past medical,surgical, social and family history.  Reviewed problem list, medications and allergies. Physical Assessment:   Vitals:   04/22/23 0858  BP: 116/87  Pulse: 85  Weight: 148 lb (67.1 kg)  Height: 5\' 5"  (1.651 m)  Body mass index is 24.63 kg/m.        Physical Examination:   General appearance - well appearing, and in no distress  Mental status - alert, oriented to person, place, and time  Psych:  She has a normal mood and affect  Skin - warm and dry, normal color, no suspicious lesions noted  Chest - effort normal, all lung fields clear to auscultation bilaterally  Heart - normal rate and regular rhythm  Neck:  midline trachea, no thyromegaly or nodules  Breasts - breasts appear normal, no suspicious masses, no skin or nipple changes or  axillary nodes  Abdomen - soft, nontender, nondistended, no masses or organomegaly  Pelvic - VULVA: normal appearing vulva with no masses, tenderness or lesions  VAGINA: normal appearing vagina with normal color and discharge, no lesions  CERVIX: no CMT  UTERUS: uterus is felt to be normal size, shape, consistency and nontender  ADNEXA: No adnexal masses or tenderness noted  Chaperone present for exam  No results found for this or any previous visit (from the past 24 hour(s)).  Assessment & Plan:  1) Well-Woman Exam MMG scheduled 8/21 UTD on pap & CSY Declines HCV today Will start COCs for perimenopause symptoms  Meds:  Meds ordered this encounter  Medications   Norethindrone Acetate-Ethinyl Estrad-FE (JUNEL FE 24) 1-20 MG-MCG(24) tablet    Sig: Take 1 tablet by mouth daily.    Dispense:  90 tablet    Refill:  3    Follow-up: Return in about 1 year (around 04/21/2024) for annual exam or sooner as  needed.  Lennart Pall, MD 04/25/2023 4:27 PM

## 2023-05-05 ENCOUNTER — Ambulatory Visit (INDEPENDENT_AMBULATORY_CARE_PROVIDER_SITE_OTHER): Payer: Medicaid Other

## 2023-05-05 DIAGNOSIS — Z1231 Encounter for screening mammogram for malignant neoplasm of breast: Secondary | ICD-10-CM | POA: Diagnosis not present

## 2023-11-11 IMAGING — MG DIGITAL SCREENING BREAST BILAT IMPLANT W/ TOMO W/ CAD
9 of 12 series · 9 of 28 positions shown · non-contrast
Comparison: Previous exam(s).

CLINICAL DATA: Screening.

EXAM:
DIGITAL SCREENING BILATERAL MAMMOGRAM WITH IMPLANTS, CAD AND
TOMOSYNTHESIS
TECHNIQUE: Bilateral screening digital craniocaudal and mediolateral oblique
mammograms were obtained. Bilateral screening digital breast
tomosynthesis was performed. The images were evaluated with
computer-aided detection. Standard and/or implant displaced views
were performed.

[L MLO]
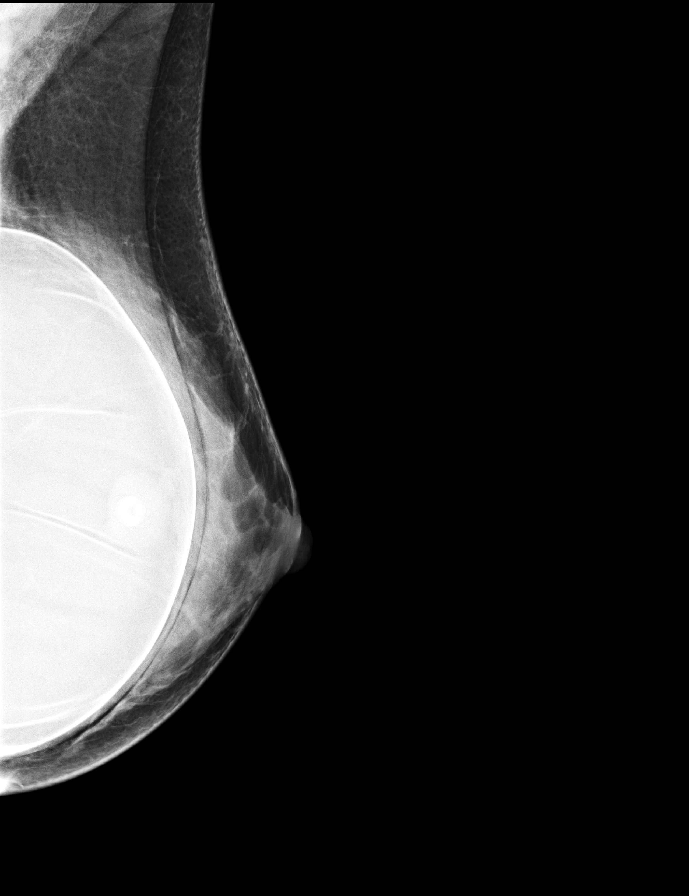

[R CC]
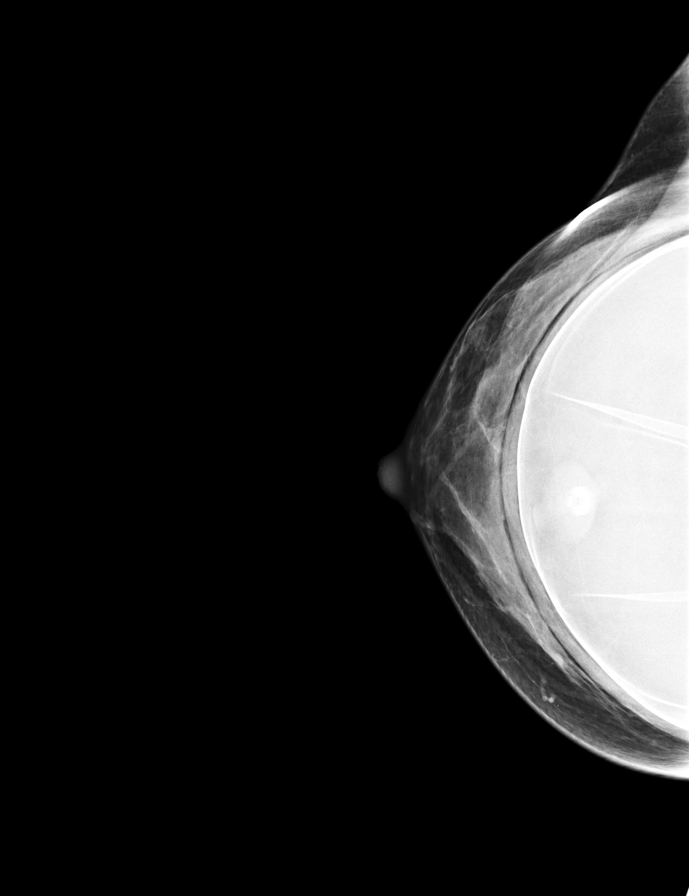

[L CC]
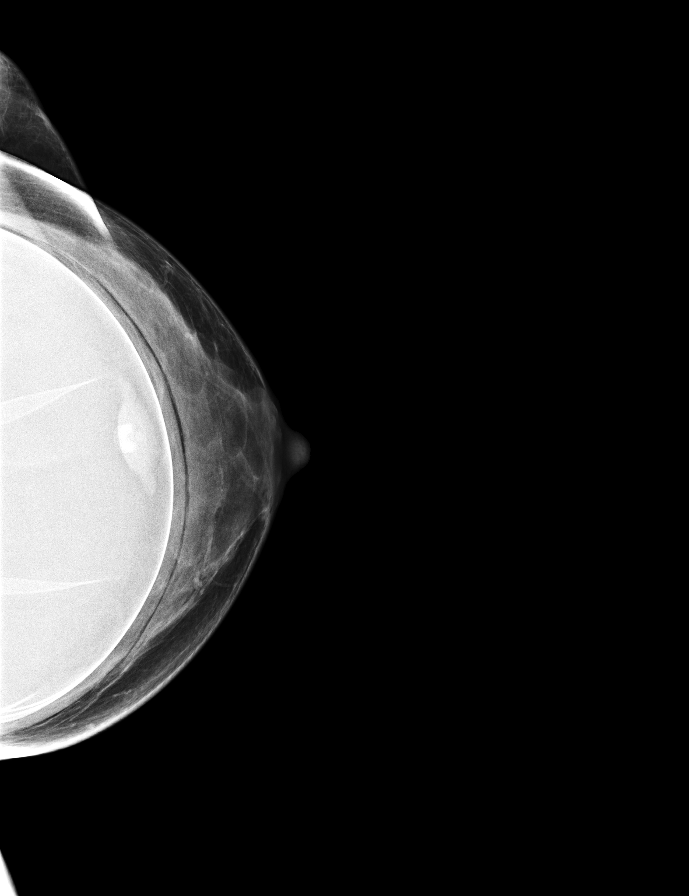

[R MLO]
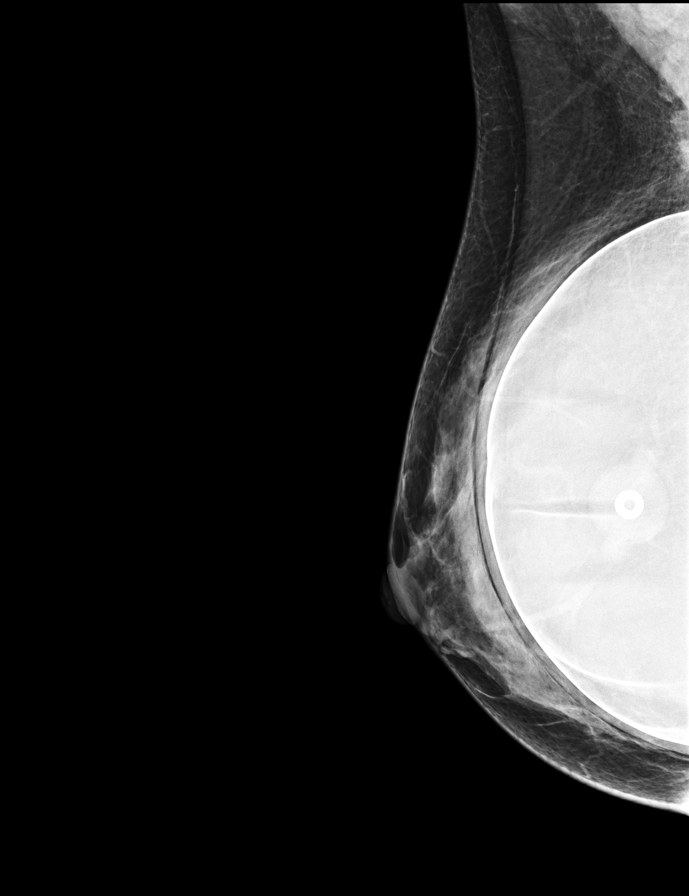

[R CC synth-2D]
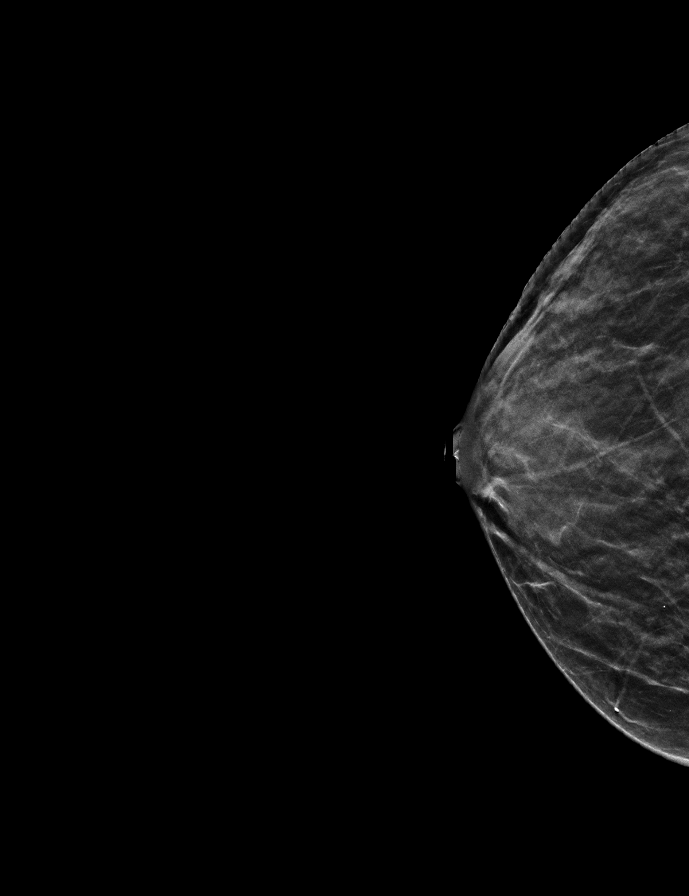

[R MLO synth-2D]
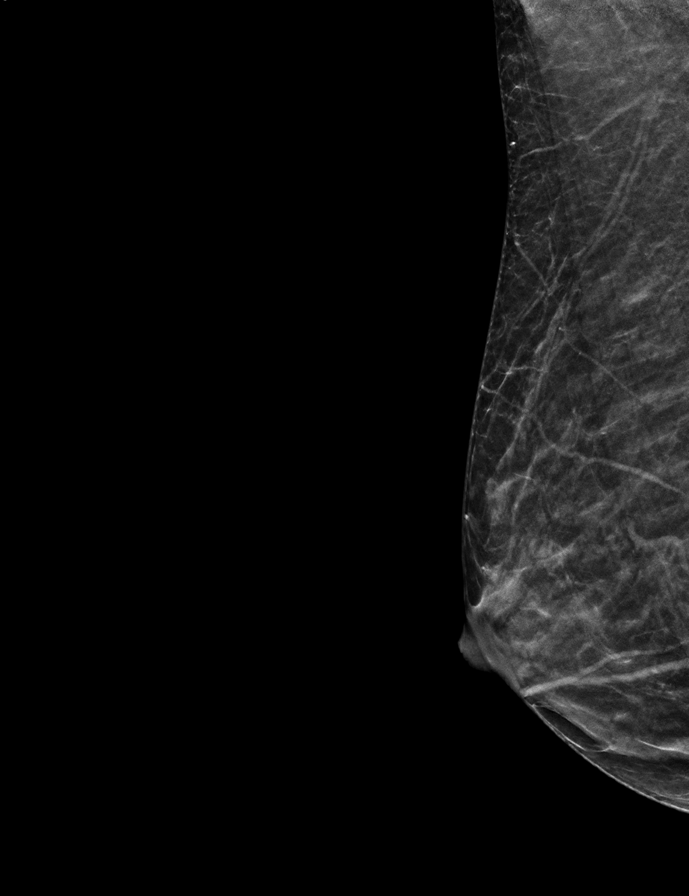

[L MLO synth-2D]
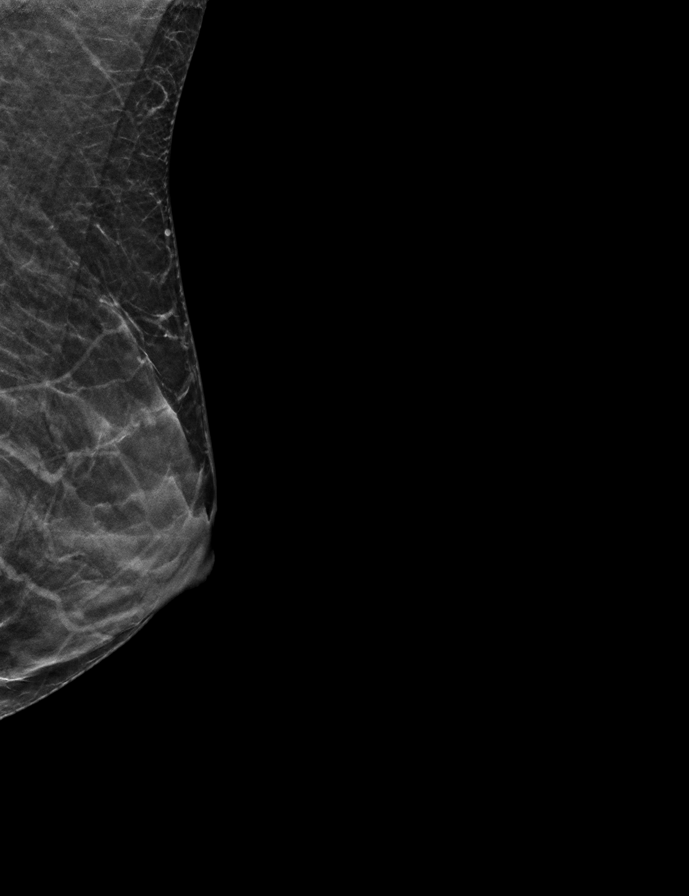

[L CC synth-2D]
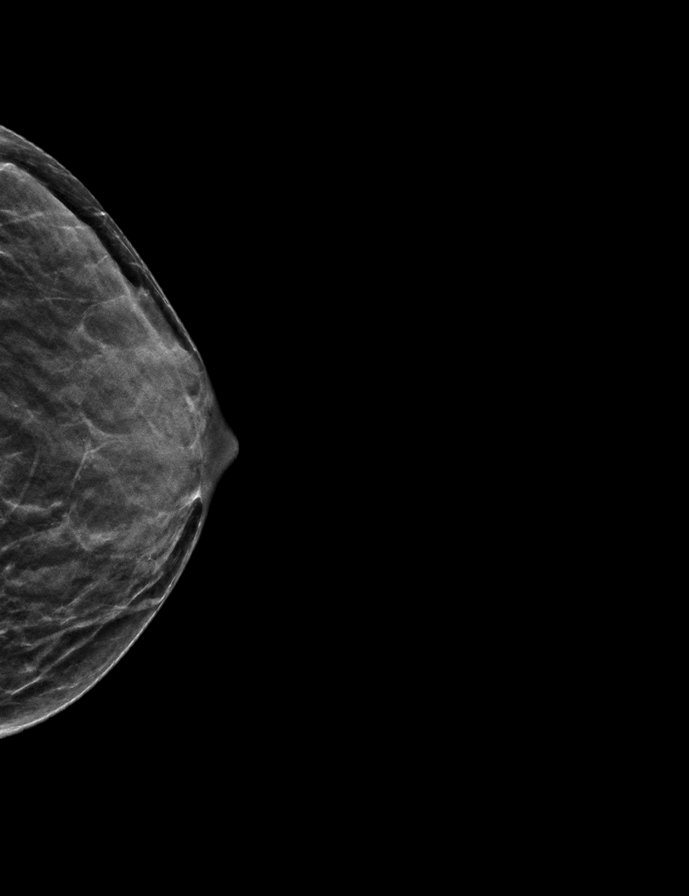

[R CC tomo · tomo slice 15/28.0]
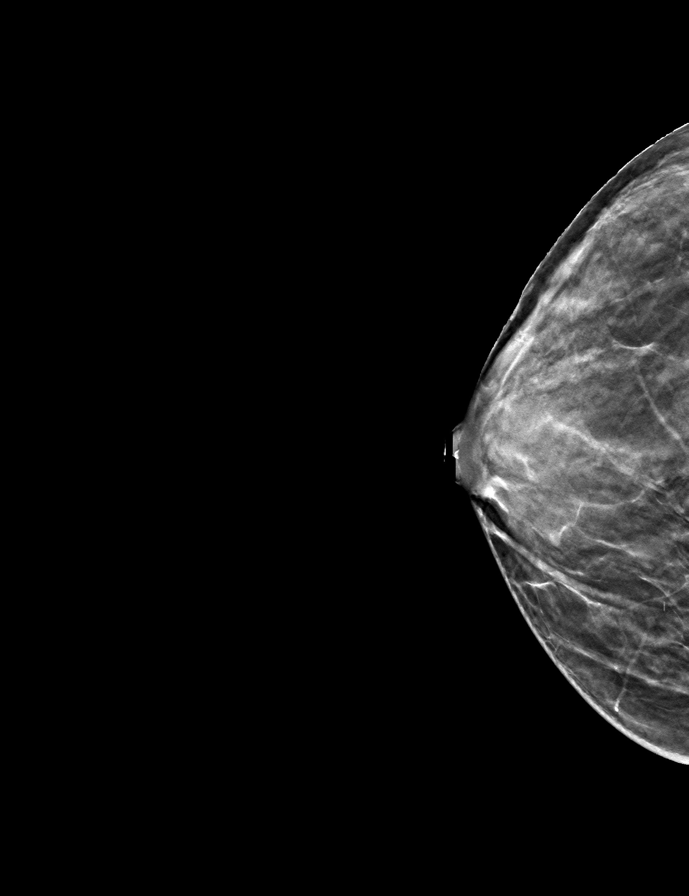

[9 of 28 positions shown; findings below may reference images not displayed]

ACR Breast Density Category c: The breast tissue is heterogeneously
dense, which may obscure small masses.
FINDINGS: The patient has retropectoral implants. There are no findings
suspicious for malignancy.
IMPRESSION: No mammographic evidence of malignancy. A result letter of this
screening mammogram will be mailed directly to the patient.

RECOMMENDATION:
Screening mammogram in one year. (Code:LT-E-7TH)

BI-RADS CATEGORY  1:  Negative.

## 2024-03-05 ENCOUNTER — Other Ambulatory Visit: Payer: Self-pay | Admitting: Obstetrics and Gynecology

## 2024-09-20 ENCOUNTER — Other Ambulatory Visit (HOSPITAL_COMMUNITY)
Admission: RE | Admit: 2024-09-20 | Discharge: 2024-09-20 | Disposition: A | Discharge status: Home / Self Care | Source: Ambulatory Visit | Attending: Obstetrics and Gynecology | Admitting: Obstetrics and Gynecology

## 2024-09-20 ENCOUNTER — Ambulatory Visit: Admitting: Obstetrics and Gynecology

## 2024-09-20 ENCOUNTER — Encounter: Payer: Self-pay | Admitting: Obstetrics and Gynecology

## 2024-09-20 VITALS — BP 123/80 | HR 67 | Ht 65.0 in | Wt 150.0 lb

## 2024-09-20 DIAGNOSIS — Z01419 Encounter for gynecological examination (general) (routine) without abnormal findings: Secondary | ICD-10-CM | POA: Diagnosis present

## 2024-09-20 DIAGNOSIS — N898 Other specified noninflammatory disorders of vagina: Secondary | ICD-10-CM | POA: Diagnosis present

## 2024-09-20 DIAGNOSIS — Z1231 Encounter for screening mammogram for malignant neoplasm of breast: Secondary | ICD-10-CM

## 2024-09-20 NOTE — Progress Notes (Signed)
 "  ANNUAL EXAM Patient name: [name] MRN [id number]  Date of birth: 1976/05/17 Chief Complaint:   Gynecologic Exam (Annual. Pt reported feels like the patch and prometrium has helped level things out. Pt reported has vaginal odor, would like to be checked for BV/Yeast, no urinary symptoms)  History of Present Illness:   [name] is a 49 y.o. [id number] with Patient's last menstrual period was 09/08/2024 (exact date). being seen today for a routine annual exam.  Current complaints:   Switched from COCs to patch + prometrium about 5 months ago through her endocrinologist. Feels like the patch and prometrium has helped level things out. Has vaginal odor, would like to be checked for BV/Yeast, no urinary symptoms   Still having regular cycles.  Upstream - 09/28/24 2032       Pregnancy Intention Screening   Does the patient want to become pregnant in the next year? No    Does the patient's partner want to become pregnant in the next year? No    Would the patient like to discuss contraceptive options today? No      Contraception Wrap Up   Current Method Vasectomy    End Method Vasectomy    Contraception Counseling Provided No    How was the end contraceptive method provided? N/A         The pregnancy intention screening data noted above was reviewed. Potential methods of contraception were discussed. The patient elected to proceed with Vasectomy.   Last pap 09/29/21. Results were: NILM w/ HRHPV negative Last mammogram: 05/05/23. Results were: normal Last colonoscopy: 07/17/2019. Next due in 10 years     09/20/2024   10:09 AM 06/28/2018    1:58 PM  Depression screen PHQ 2/9  Decreased Interest 0 0  Down, Depressed, Hopeless 0 1  PHQ - 2 Score 0 1  Altered sleeping 1 0  Tired, decreased energy 1 0  Change in appetite 0 0  Feeling bad or failure about yourself  1 1  Trouble concentrating 3 2  Moving slowly or fidgety/restless 0 1  Suicidal thoughts 0 0  PHQ-9 Score 6 5    Difficult doing work/chores  Somewhat difficult     Data saved with a previous flowsheet row definition        09/20/2024   10:09 AM 06/28/2018    2:00 PM  GAD 7 : Generalized Anxiety Score  Nervous, Anxious, on Edge 1 2  Control/stop worrying 0 1  Worry too much - different things 0 0  Trouble relaxing 2 2  Restless 1 2  Easily annoyed or irritable 1 1  Afraid - awful might happen 0 0  Total GAD 7 Score 5 8  Anxiety Difficulty  Somewhat difficult     Review of Systems:   Pertinent items are noted in HPI Denies any headaches, blurred vision, fatigue, shortness of breath, chest pain, abdominal pain, abnormal vaginal discharge/itching/odor/irritation, problems with periods, bowel movements, urination, or intercourse unless otherwise stated above. Pertinent History Reviewed:  Reviewed past medical,surgical, social and family history.  Reviewed problem list, medications and allergies. Physical Assessment:   Vitals:   09/20/24 1000  BP: 123/80  Pulse: 67  Weight: 150 lb (68 kg)  Height: 5' 5 (1.651 m)  Body mass index is 24.96 kg/m.        Physical Examination:   General appearance - well appearing, and in no distress  Mental status - alert, oriented to person, place, and time  Chest - respiratory  effort normal  Heart - normal peripheral perfusion  Breasts - deferred, no concerns  Pelvic - offered, deferred  No results found for this or any previous visit (from the past 24 hours).  Assessment & Plan:  1) Well-Woman Exam Mammogram: schedule screening mammo as soon as possible Colonoscopy: Up to date, due 2030 or sooner if problems Pap: Up to date, due 2028 GC/CT: UTD HIV/HCV: UTD Vasectomy for contraception Continue estradiol patch/prometrium  2) Vaginal odor Swabs collected for BV/yeast  Labs/procedures today:   Orders Placed This Encounter  Procedures   MM 3D SCREENING MAMMOGRAM BILATERAL BREAST    Follow-up: Return in about 2 years (around 09/20/2026)  for annual exam with pap.  Kieth JAYSON Carolin, MD 09/28/2024 8:32 PM  "

## 2024-09-20 NOTE — Patient Instructions (Addendum)
 It was nice catching up with you today! You will see your results in the MyChart app within 1 week  Avoid: - Synthetic underwear - Tight pants - Swim suits, thongs, leotards, leggings for prolonged periods of time - Scented soap/shampoo - Bubble baths - Scented detergents, dryer sheets - Baby wipes - Feminine sprays, douches, powders - Panty liners - Dyed toilet paper - Shaving  Trying swapping out the above for: - Cotton or no underwear - Loose pants, skirts, dresses - Changing out of swimwear, thongs, and workout gear as soon as you're done exercising - Fragrance free soaps (like Dove sensitive skin) - Warm plain water baths - Unscented laundry detergent - Use a bedet or peri bottle to rinse instead of baby wipes - Tampons, cotton pads, cotton period underwear - Undyed toilet paper - Clipping hair

## 2024-09-21 LAB — CERVICOVAGINAL ANCILLARY ONLY
Bacterial Vaginitis (gardnerella): NEGATIVE
Comment: NEGATIVE

## 2024-09-25 ENCOUNTER — Ambulatory Visit: Payer: Self-pay | Admitting: Obstetrics and Gynecology

## 2024-10-07 ENCOUNTER — Other Ambulatory Visit: Payer: Self-pay | Admitting: Medical Genetics

## 2024-10-07 ENCOUNTER — Encounter (INDEPENDENT_AMBULATORY_CARE_PROVIDER_SITE_OTHER): Payer: Self-pay

## 2024-10-13 ENCOUNTER — Other Ambulatory Visit: Payer: Self-pay | Admitting: Obstetrics and Gynecology

## 2024-10-13 ENCOUNTER — Ambulatory Visit

## 2024-10-13 DIAGNOSIS — Z01419 Encounter for gynecological examination (general) (routine) without abnormal findings: Secondary | ICD-10-CM

## 2024-10-13 DIAGNOSIS — N898 Other specified noninflammatory disorders of vagina: Secondary | ICD-10-CM

## 2024-10-13 DIAGNOSIS — Z1231 Encounter for screening mammogram for malignant neoplasm of breast: Secondary | ICD-10-CM | POA: Diagnosis not present

## 2024-10-18 ENCOUNTER — Ambulatory Visit: Payer: Self-pay | Admitting: Obstetrics and Gynecology

## 2024-11-15 ENCOUNTER — Other Ambulatory Visit (HOSPITAL_COMMUNITY)
